# Patient Record
Sex: Male | Born: 1940 | Race: Black or African American | Hispanic: No | Marital: Married | Smoking: Former smoker
Health system: Southern US, Community
[De-identification: ages and names within clinical notes are randomized; demographics above are authoritative.]

## PROBLEM LIST (undated history)

## (undated) DIAGNOSIS — C801 Malignant (primary) neoplasm, unspecified: Secondary | ICD-10-CM

## (undated) DIAGNOSIS — D759 Disease of blood and blood-forming organs, unspecified: Secondary | ICD-10-CM

## (undated) DIAGNOSIS — H409 Unspecified glaucoma: Secondary | ICD-10-CM

## (undated) DIAGNOSIS — J45909 Unspecified asthma, uncomplicated: Secondary | ICD-10-CM

## (undated) HISTORY — PX: EYE SURGERY: SHX253

## (undated) HISTORY — PX: PROSTATE SURGERY: SHX751

## (undated) HISTORY — PX: TONSILLECTOMY: SUR1361

## (undated) HISTORY — PX: JOINT REPLACEMENT: SHX530

---

## 1997-10-06 ENCOUNTER — Inpatient Hospital Stay (HOSPITAL_COMMUNITY): Admission: RE | Admit: 1997-10-06 | Discharge: 1997-10-11 | Payer: Self-pay | Admitting: Orthopedic Surgery

## 1997-10-13 ENCOUNTER — Ambulatory Visit (HOSPITAL_COMMUNITY): Admission: RE | Admit: 1997-10-13 | Discharge: 1997-10-13 | Payer: Self-pay | Admitting: Orthopedic Surgery

## 1997-10-17 ENCOUNTER — Emergency Department (HOSPITAL_COMMUNITY): Admission: EM | Admit: 1997-10-17 | Discharge: 1997-10-17 | Payer: Self-pay | Admitting: Emergency Medicine

## 1997-10-20 ENCOUNTER — Ambulatory Visit (HOSPITAL_COMMUNITY): Admission: RE | Admit: 1997-10-20 | Discharge: 1997-10-20 | Payer: Self-pay | Admitting: Orthopedic Surgery

## 1997-10-22 ENCOUNTER — Ambulatory Visit (HOSPITAL_COMMUNITY): Admission: RE | Admit: 1997-10-22 | Discharge: 1997-10-22 | Payer: Self-pay | Admitting: *Deleted

## 1997-11-05 ENCOUNTER — Inpatient Hospital Stay (HOSPITAL_COMMUNITY): Admission: RE | Admit: 1997-11-05 | Discharge: 1997-11-08 | Payer: Self-pay | Admitting: Orthopedic Surgery

## 1998-05-24 ENCOUNTER — Ambulatory Visit (HOSPITAL_COMMUNITY): Admission: RE | Admit: 1998-05-24 | Discharge: 1998-05-24 | Payer: Self-pay | Admitting: Orthopedic Surgery

## 1998-05-24 ENCOUNTER — Encounter: Payer: Self-pay | Admitting: Orthopedic Surgery

## 1999-01-24 ENCOUNTER — Other Ambulatory Visit: Admission: RE | Admit: 1999-01-24 | Discharge: 1999-01-24 | Payer: Self-pay | Admitting: Urology

## 1999-03-24 ENCOUNTER — Encounter: Payer: Self-pay | Admitting: Urology

## 1999-03-28 ENCOUNTER — Encounter (INDEPENDENT_AMBULATORY_CARE_PROVIDER_SITE_OTHER): Payer: Self-pay | Admitting: Specialist

## 1999-03-28 ENCOUNTER — Inpatient Hospital Stay (HOSPITAL_COMMUNITY): Admission: RE | Admit: 1999-03-28 | Discharge: 1999-04-01 | Payer: Self-pay | Admitting: Urology

## 1999-12-19 ENCOUNTER — Encounter (INDEPENDENT_AMBULATORY_CARE_PROVIDER_SITE_OTHER): Payer: Self-pay | Admitting: *Deleted

## 1999-12-19 ENCOUNTER — Ambulatory Visit (HOSPITAL_COMMUNITY): Admission: RE | Admit: 1999-12-19 | Discharge: 1999-12-19 | Payer: Self-pay | Admitting: Gastroenterology

## 2001-11-25 ENCOUNTER — Encounter: Payer: Self-pay | Admitting: Internal Medicine

## 2001-11-25 ENCOUNTER — Encounter: Admission: RE | Admit: 2001-11-25 | Discharge: 2001-11-25 | Payer: Self-pay | Admitting: Internal Medicine

## 2007-11-14 ENCOUNTER — Ambulatory Visit (HOSPITAL_BASED_OUTPATIENT_CLINIC_OR_DEPARTMENT_OTHER): Admission: RE | Admit: 2007-11-14 | Discharge: 2007-11-14 | Payer: Self-pay | Admitting: Orthopedic Surgery

## 2007-11-14 ENCOUNTER — Encounter (INDEPENDENT_AMBULATORY_CARE_PROVIDER_SITE_OTHER): Payer: Self-pay | Admitting: Orthopedic Surgery

## 2010-08-08 NOTE — Op Note (Signed)
NAME:  FRANCISO, DIERKS NO.:  [id number]   MEDICAL RECORD NO.:  [id number]          PATIENT TYPE:  AMB   LOCATION:  DSC                          FACILITY:  [facility]   PHYSICIAN:  Katy Fitch. Sypher, M.D. DATE OF BIRTH:  29-Dec-1940   DATE OF PROCEDURE:  11/14/2007  DATE OF DISCHARGE:                               OPERATIVE REPORT   PREOPERATIVE DIAGNOSIS:  Enlarging uncomfortable mass, right palm  overlying right ring finger flexor sheath, A1 pulley, and extending  towards small finger flexor sheath.   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   OPERATION:  Excisional biopsy of complex myxomatous mass, right palm.   OPERATING SURGEON:  Katy Fitch. Sypher, MD   No assistant.   ANESTHESIA:  Lidocaine 2% supplemented by IV sedation.  Supervising  anesthesiologist is Quita Skye. Krista Blue, MD.   INDICATIONS:  Joel Howell. Russett is a 70 year old gentleman referred  through the courtesy of Dr. Kirby Funk for evaluation and management  of an enlarging right palmar mass.  Clinical examination revealed an  enlarging mass consistent with a possible giant cell tumor or ganglion  adjacent to the flexor sheath of the ring finger extending ulnarly.  This was uncomfortable with pressure.   We advised excisional biopsy for diagnosis and hopeful resolution of  this predicament.   After informed consent, he is brought to the operating room at this  time.  Preoperatively, he was advised that we could not offer prognosis  until a diagnosis was achieved.   PROCEDURE:  Joel Howell. Bernardini was brought to the operating room and  placed in a supine position on the operating table.   Following sedation, the right arm was prepped with Betadine soap  solution and sterilely  draped.  Lidocaine 2% was infiltrated into the  path of the intended incision and around and in the flexor sheath of the  ring finger.   After 5 minutes, anesthesia was excellent.   The right arm was exsanguinated with an Esmarch bandage and an arterial  tourniquet on the proximal brachium inflated to 235 mmHg.   Procedure commenced with a partial Brunner zig-zag incision centered  over the distal palmar crease.  Subcutaneous tissues were carefully  divided revealing a tense purplish gray myxomatous mass.  This was not  the typical flexor sheath ganglion that was well circumscribed.  This  was adherent to the palmar fascia.  Subcutaneous fat and surrounded the  neurovascular bundle servicing the ring and small finger.   The neurovascular bundle was gently dissected free.  The mass was  clearly adherent to the pretendinous fibers of the palmar fascia.  A  portion of the mass was filled with tense myxomatous fluid, which was  drained through a small longitudinal incision followed by careful  circumferential dissection.  Due to the extent of the mass which  measured 2.5 cm in length and about 2 cm  in width, we extended the  incision proximal to the distal palmar crease creating an extensile  exposure of the flexor sheath and neurovascular bundles servicing the  long and ring finger and ring and small fingers.   This was circumferentially dissected and removed with some myxomatous  material extending into the subcutaneous fat.   This appeared to be some type of degenerative myxomatous lesion that was  infiltrating.  There was no neovascularization noted.   The specimen was passed off in the formalin for pathologic evaluation  including some of the myxomatous-appearing fat.   The wound was then inspected for bleeding points.  Tourniquet released.  There was no significant bleeding encountered.  The wound was then  repaired with a corner suture of 5-0 nylon in simple interrupted  sutures  of 5-0 nylon.   For aftercare, Mr. Thurman was placed in compressive dressing with  Xeroflo, sterile gauze, and in a 2-inch Ace bandage.   We will see him back in followup in our office in 1 week for dressing  change, review of his pathology, and probable suture removal.      Katy Fitch. Sypher, M.D.  Electronically Signed     RVS/MEDQ  D:  11/14/2007  T:  11/14/2007  Job:  [id number]

## 2011-01-02 DIAGNOSIS — H44119 Panuveitis, unspecified eye: Secondary | ICD-10-CM | POA: Insufficient documentation

## 2011-01-02 DIAGNOSIS — H409 Unspecified glaucoma: Secondary | ICD-10-CM | POA: Insufficient documentation

## 2011-01-08 DIAGNOSIS — H4011X0 Primary open-angle glaucoma, stage unspecified: Secondary | ICD-10-CM | POA: Insufficient documentation

## 2011-01-08 DIAGNOSIS — H44519 Absolute glaucoma, unspecified eye: Secondary | ICD-10-CM | POA: Insufficient documentation

## 2011-01-08 DIAGNOSIS — H401190 Primary open-angle glaucoma, unspecified eye, stage unspecified: Secondary | ICD-10-CM | POA: Insufficient documentation

## 2012-07-29 ENCOUNTER — Other Ambulatory Visit: Payer: Self-pay | Admitting: Gastroenterology

## 2012-09-12 ENCOUNTER — Encounter (HOSPITAL_COMMUNITY): Payer: Self-pay | Admitting: *Deleted

## 2012-09-12 DIAGNOSIS — C801 Malignant (primary) neoplasm, unspecified: Secondary | ICD-10-CM

## 2012-09-12 HISTORY — DX: Malignant (primary) neoplasm, unspecified: C80.1

## 2012-09-19 ENCOUNTER — Encounter (HOSPITAL_COMMUNITY): Payer: Self-pay | Admitting: Pharmacy Technician

## 2012-09-30 ENCOUNTER — Encounter (HOSPITAL_COMMUNITY): Payer: Self-pay

## 2012-09-30 ENCOUNTER — Encounter (HOSPITAL_COMMUNITY): Payer: Self-pay | Admitting: Anesthesiology

## 2012-09-30 ENCOUNTER — Ambulatory Visit (HOSPITAL_COMMUNITY): Payer: PRIVATE HEALTH INSURANCE | Admitting: Anesthesiology

## 2012-09-30 ENCOUNTER — Encounter (HOSPITAL_COMMUNITY)
Admission: RE | Disposition: A | Discharge status: Home / Self Care | Payer: Self-pay | Source: Ambulatory Visit | Attending: Gastroenterology

## 2012-09-30 ENCOUNTER — Ambulatory Visit (HOSPITAL_COMMUNITY)
Admission: RE | Admit: 2012-09-30 | Discharge: 2012-09-30 | Disposition: A | Discharge status: Home / Self Care | Payer: PRIVATE HEALTH INSURANCE | Source: Ambulatory Visit | Attending: Gastroenterology | Admitting: Gastroenterology

## 2012-09-30 DIAGNOSIS — Z96659 Presence of unspecified artificial knee joint: Secondary | ICD-10-CM | POA: Insufficient documentation

## 2012-09-30 DIAGNOSIS — Z860100 Personal history of colon polyps, unspecified: Secondary | ICD-10-CM | POA: Insufficient documentation

## 2012-09-30 DIAGNOSIS — Z09 Encounter for follow-up examination after completed treatment for conditions other than malignant neoplasm: Secondary | ICD-10-CM | POA: Insufficient documentation

## 2012-09-30 DIAGNOSIS — Z8546 Personal history of malignant neoplasm of prostate: Secondary | ICD-10-CM | POA: Insufficient documentation

## 2012-09-30 DIAGNOSIS — E7800 Pure hypercholesterolemia, unspecified: Secondary | ICD-10-CM | POA: Insufficient documentation

## 2012-09-30 DIAGNOSIS — I10 Essential (primary) hypertension: Secondary | ICD-10-CM | POA: Insufficient documentation

## 2012-09-30 HISTORY — DX: Unspecified glaucoma: H40.9

## 2012-09-30 HISTORY — DX: Disease of blood and blood-forming organs, unspecified: D75.9

## 2012-09-30 HISTORY — PX: COLONOSCOPY WITH PROPOFOL: SHX5780

## 2012-09-30 HISTORY — DX: Malignant (primary) neoplasm, unspecified: C80.1

## 2012-09-30 HISTORY — DX: Unspecified asthma, uncomplicated: J45.909

## 2012-09-30 SURGERY — COLONOSCOPY WITH PROPOFOL
Anesthesia: Monitor Anesthesia Care

## 2012-09-30 MED ORDER — LACTATED RINGERS IV SOLN
INTRAVENOUS | Status: DC | PRN
  Administered 2012-09-30: 12:00:00 via INTRAVENOUS

## 2012-09-30 MED ORDER — SODIUM CHLORIDE 0.9 % IV SOLN
INTRAVENOUS | Status: DC
  Administered 2012-09-30: 1000 mL via INTRAVENOUS

## 2012-09-30 MED ORDER — MIDAZOLAM HCL 5 MG/5ML IJ SOLN
INTRAMUSCULAR | Status: DC | PRN
  Administered 2012-09-30: 2 mg via INTRAVENOUS

## 2012-09-30 MED ORDER — KETAMINE HCL 10 MG/ML IJ SOLN
INTRAMUSCULAR | Status: DC | PRN
  Administered 2012-09-30: 20 mg via INTRAVENOUS

## 2012-09-30 MED ORDER — PROPOFOL INFUSION 10 MG/ML OPTIME
INTRAVENOUS | Status: DC | PRN
  Administered 2012-09-30: 140 ug/kg/min via INTRAVENOUS

## 2012-09-30 MED ORDER — LACTATED RINGERS IV SOLN: INTRAVENOUS | Status: DC

## 2012-09-30 SURGICAL SUPPLY — 21 items

## 2012-09-30 NOTE — Transfer of Care (Signed)
Immediate Anesthesia Transfer of Care Note  Patient: Joel Howell  Procedure(s) Performed: Procedure(s): COLONOSCOPY WITH PROPOFOL (N/A)  Patient Location: PACU and Endoscopy Unit  Anesthesia Type:MAC  Level of Consciousness: awake and alert   Airway & Oxygen Therapy: Patient Spontanous Breathing and Patient connected to face mask oxygen  Post-op Assessment: Report given to PACU RN and Post -op Vital signs reviewed and stable  Post vital signs: Reviewed and stable  Complications: No apparent anesthesia complications

## 2012-09-30 NOTE — H&P (Signed)
  Procedure: Surveillance colonoscopy.  History: The patient is a 72 year old male born 124-Apr-1942. The patient has undergone colonoscopic exams in the past to remove adenomatous colon polyps. He is scheduled to undergo a repeat surveillance colonoscopy today.  Past medical history: Hypertension. Prostate cancer surgery. Duodenal ulcer bleeds. Remote treatment for H. pylori gastritis. Thalassemia minor. Knee replacement surgery. Hypercholesterolemia. Colonic diverticulosis. Glaucoma.  Habits: The patient smoking cigarettes in 1981. He consumes alcohol in moderation.  Medication allergies: Penicillin  Exam: The patient is alert and lying comfortably on the endoscopy stretcher. Abdomen is soft and nontender to palpation. Cardiac exam reveals a regular rhythm. Lungs are clear to auscultation  Plan: Proceed with surveillance colonoscopy.

## 2012-09-30 NOTE — Anesthesia Preprocedure Evaluation (Signed)
Anesthesia Evaluation  Patient identified by MRN, date of birth, ID band Patient awake    Reviewed: Allergy & Precautions, H&P , NPO status , Patient's Chart, lab work & pertinent test results  Airway Mallampati: III TM Distance: >3 FB Neck ROM: Full    Dental  (+) Teeth Intact, Caps and Dental Advisory Given   Pulmonary neg pulmonary ROS,  breath sounds clear to auscultation  Pulmonary exam normal       Cardiovascular negative cardio ROS  Rhythm:Regular     Neuro/Psych negative neurological ROS  negative psych ROS   GI/Hepatic negative GI ROS, Neg liver ROS,   Endo/Other  negative endocrine ROS  Renal/GU negative Renal ROS  negative genitourinary   Musculoskeletal negative musculoskeletal ROS (+)   Abdominal   Peds  Hematology  (+) Blood dyscrasia, anemia ,   Anesthesia Other Findings   Reproductive/Obstetrics                           Anesthesia Physical Anesthesia Plan  ASA: II  Anesthesia Plan: MAC   Post-op Pain Management:    Induction: Intravenous  Airway Management Planned: Simple Face Mask  Additional Equipment:   Intra-op Plan:   Post-operative Plan: Extubation in OR  Informed Consent: I have reviewed the patients History and Physical, chart, labs and discussed the procedure including the risks, benefits and alternatives for the proposed anesthesia with the patient or authorized representative who has indicated his/her understanding and acceptance.   Dental advisory given  Plan Discussed with: CRNA  Anesthesia Plan Comments:         Anesthesia Quick Evaluation

## 2012-09-30 NOTE — Op Note (Signed)
Procedure: Surveillance colonoscopy. History of adenomatous colon polyps.  Endoscopist: Danise Edge  Premedication: Propofol administered by anesthesia  Procedure: The patient was placed in the left lateral decubitus position. Anal inspection and digital rectal exam were normal. The Pentax pediatric colonoscope was introduced into the rectum and advanced to the cecum. A normal-appearing ileocecal valve and appendiceal orifice were identified. Colonic preparation for the exam today was good except in the cecum and left colon where there was residual solid fecal matter.   Rectum. Normal. Retroflex view of the distal rectum normal.  Sigmoid colon and descending colon. Normal.  Splenic flexure. Normal.  Transverse colon. Normal.  Hepatic flexure. Normal.  Ascending colon. Normal.  Cecum and ileocecal valve. Normal.  Assessment: Normal surveillance proctocolonoscopy to the cecum  Recommendations: Schedule repeat surveillance colonoscopy in 5 years.

## 2012-09-30 NOTE — Anesthesia Postprocedure Evaluation (Signed)
Anesthesia Post Note  Patient: Joel Howell  Procedure(s) Performed: Procedure(s) (LRB): COLONOSCOPY WITH PROPOFOL (N/A)  Anesthesia type: MAC  Patient location: PACU  Post pain: Pain level controlled  Post assessment: Post-op Vital signs reviewed  Last Vitals:  Filed Vitals:   09/30/12 1321  BP: 125/36  Pulse:   Temp:   Resp: 12    Post vital signs: Reviewed  Level of consciousness: sedated  Complications: No apparent anesthesia complications

## 2012-10-01 ENCOUNTER — Encounter (HOSPITAL_COMMUNITY): Payer: Self-pay | Admitting: Gastroenterology

## 2013-10-08 ENCOUNTER — Other Ambulatory Visit: Payer: Self-pay | Admitting: *Deleted

## 2013-10-08 DIAGNOSIS — R200 Anesthesia of skin: Secondary | ICD-10-CM

## 2013-11-18 ENCOUNTER — Ambulatory Visit (INDEPENDENT_AMBULATORY_CARE_PROVIDER_SITE_OTHER): Payer: Medicare HMO | Admitting: Neurology

## 2013-11-18 DIAGNOSIS — R200 Anesthesia of skin: Secondary | ICD-10-CM

## 2013-11-18 DIAGNOSIS — G5622 Lesion of ulnar nerve, left upper limb: Secondary | ICD-10-CM

## 2013-11-18 DIAGNOSIS — G5603 Carpal tunnel syndrome, bilateral upper limbs: Secondary | ICD-10-CM

## 2013-11-18 DIAGNOSIS — R209 Unspecified disturbances of skin sensation: Secondary | ICD-10-CM

## 2013-11-18 DIAGNOSIS — M5412 Radiculopathy, cervical region: Secondary | ICD-10-CM

## 2013-11-18 NOTE — Procedures (Signed)
[facility]  [address]  [place], [place] [address] Tel: [phone] Fax:  [phone] Test Date:  11/18/2013  Patient: Joel Howell DOB: March 01, 1941 Physician: Narda Amber  Sex: Male Height: 6\' 2"  Ref Phys: Irven Shelling  ID#: [id number] Temp: 35.2 Technician:    Patient Complaints: This is a 73 year-old gentleman presenting for evaluation of left hand numbness/tingling which is worse at night. No associated weakness or neck pain.  NCV & EMG Findings: Extensive electrodiagnostic testing of the left upper extremity and additional studies of the right reveals:  1. Left median sensory response is absent. The right median and bilateral ulnar sensory responses are prolonged with preserved amplitudes. Bilateral radial sensory responses are within normal limits. 2. Left median motor nerve showed prolonged distal onset latency (6.5 ms) and borderline decreased conduction velocity (Elbow-Wrist, 47 m/s).  The right median motor nerve showed decreased conduction velocity (Elbow-Wrist, 46 m/s), suggesting that conduction velocity findings are most likely due to using longer lengths when stimulating the nerve, due to patient's height and larger body habitus.  The left ulnar motor nerve showed prolonged distal onset latency (3.4 ms), reduced amplitude (6.3 mV), and decreased conduction velocity (A Elbow-B Elbow, 42 m/s).   3. F Wave latencies were within normal limits.   4. On needle electrode examination, chronic motor axon loss changes are seen affecting the ulnar innervated muscles on the left without accompanied active changes. Additionally, mild motor axonal loss changes are seen affecting the left abductor pollicis brevis and bilateral pronator teres and triceps muscles.  Impression: 1. Bilateral median neuropathy, at or distal to the wrist, consistent with the clinical diagnosis of carpal tunnel syndrome, markedly worse on the left. Overall, these findings are  moderate-to-severe in degree electrically on the left and mild on the right side. 2. Left ulnar neuropathy across the elbow, demyelinating with secondary axon loss, mild-to-moderate in degree electrically. 3. 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 4. Demyelinating changes involving the right ulnar sensory nerves and borderline slowing of conduction velocities involving the median motor nerves most likely are due to stimulating the nerves at the longer distances because of patient's body habitus, and unlikely to represent a polyradiculoneuropathy.  If clinically indicated, a repeat study in 3-6 months can be considered.  ___________________________ Narda Amber    Nerve Conduction Studies Anti Sensory Summary Table   Stim Site NR Peak (ms) Norm Peak (ms) P-T Amp (V) Norm P-T Amp  Left Median Anti Sensory (2nd Digit)  Wrist NR  <3.8  >10  Right Median Anti Sensory (2nd Digit)    distance 14cm due to large hands  Wrist    4.9 <3.8 15.5 >10  Left Radial Anti Sensory (Base 1st Digit)  Wrist    2.8 <2.8 12.6 >10  Right Radial Anti Sensory (Base 1st Digit)  Wrist    2.8 <2.8 16.0 >10  Left Ulnar Anti Sensory (5th Digit)    distance 14cm due to large hands  Wrist    3.9 <3.2 9.6 >5  Right Ulnar Anti Sensory (5th Digit)  Wrist    4.3 <3.2 13.9 >5   Motor Summary Table   Stim Site NR Onset (ms) Norm Onset (ms) O-P Amp (mV) Norm O-P Amp Site1 Site2 Delta-0 (ms) Dist (cm) Vel (m/s) Norm Vel (m/s)  Left Median Motor (Abd Poll Brev)  Wrist    6.5 <4.0 5.7 >5 Elbow Wrist 7.7 36.0 47 >50  Elbow  14.2  5.9  Axilla Elbow 8.2 0.0    Axilla    6.0  2.9         Right Median Motor (Abd Poll Brev)    distance  from wrist to APB 6cm  Wrist    3.8 <4.0 9.0 >5 Elbow Wrist 7.4 34.0 46 >50  Elbow    11.2  8.1         Left Ulnar Motor (Abd Dig Minimi)  Wrist    3.4 <3.1 6.3 >7 B Elbow Wrist 5.2 27.0 52 >50  B Elbow    8.6  5.4  A Elbow B Elbow  2.6 11.0 42 >50  A Elbow    11.2  5.0         Right Ulnar Motor (Abd Dig Minimi)  Wrist    3.1 <3.1 7.2 >7 B Elbow Wrist 5.1 27.0 53 >50  B Elbow    8.2  6.6  A Elbow B Elbow 2.3 12.0 52 >50  A Elbow    10.5  6.0          F Wave Studies   NR F-Lat (ms) Lat Norm (ms) L-R F-Lat (ms)  Left Ulnar (Mrkrs) (Abd Dig Min)     27.20 <33    EMG   Side Muscle Ins Act Fibs Psw Fasc Number Recrt Dur Dur. Amp Amp. Poly Poly. Comment  Left 1stDorInt Nml Nml Nml Nml 1- Mod-R Some 1+ Few 1+ Nml Nml N/A  Left Abd Poll Brev Nml Nml Nml Nml 1- Mod-R Few 1+ Nml Nml Nml Nml N/A  Left ABD Dig Min Nml Nml Nml Nml 1- Mod-R Some 1+ Nml Nml Nml Nml N/A  Left Ext Indicis Nml Nml Nml Nml Nml Nml Nml Nml Nml Nml Nml Nml N/A  Left PronatorTeres Nml Nml Nml Nml 1- Mod Few 1+ Nml Nml Nml Nml N/A  Left Biceps Nml Nml Nml Nml Nml Nml Nml Nml Nml Nml Nml Nml N/A  Left Triceps Nml Nml Nml Nml 1- Mod-R Few 1+ Nml Nml Nml Nml N/A  Left FlexDigProf 4,5 Nml Nml Nml Nml 1- Mod-R Some 1+ Nml Nml Nml Nml N/A  Left Deltoid Nml Nml Nml Nml Nml Nml Nml Nml Nml Nml Nml Nml N/A  Right 1stDorInt Nml Nml Nml Nml Nml Nml Nml Nml Nml Nml Nml Nml N/A  Right Abd Poll Brev Nml Nml Nml Nml Nml Nml Nml Nml Nml Nml Nml Nml N/A  Right PronatorTeres Nml Nml Nml Nml 1- Mod-R Few 1+ Nml Nml Nml Nml N/A  Right Biceps Nml Nml Nml Nml Nml Nml Nml Nml Nml Nml Nml Nml N/A  Right Triceps Nml Nml Nml Nml 1- Mod-R Few 1+ Nml Nml Nml Nml N/A      Waveforms:

## 2015-04-22 DIAGNOSIS — R5383 Other fatigue: Secondary | ICD-10-CM | POA: Diagnosis not present

## 2015-04-22 DIAGNOSIS — R0600 Dyspnea, unspecified: Secondary | ICD-10-CM | POA: Diagnosis not present

## 2015-04-26 DIAGNOSIS — I499 Cardiac arrhythmia, unspecified: Secondary | ICD-10-CM | POA: Insufficient documentation

## 2015-04-26 DIAGNOSIS — R0600 Dyspnea, unspecified: Secondary | ICD-10-CM | POA: Insufficient documentation

## 2015-04-29 ENCOUNTER — Ambulatory Visit (INDEPENDENT_AMBULATORY_CARE_PROVIDER_SITE_OTHER): Payer: PPO

## 2015-04-29 DIAGNOSIS — I499 Cardiac arrhythmia, unspecified: Secondary | ICD-10-CM

## 2015-04-29 DIAGNOSIS — R0600 Dyspnea, unspecified: Secondary | ICD-10-CM

## 2015-05-02 DIAGNOSIS — I499 Cardiac arrhythmia, unspecified: Secondary | ICD-10-CM | POA: Diagnosis not present

## 2015-05-02 DIAGNOSIS — R0600 Dyspnea, unspecified: Secondary | ICD-10-CM | POA: Diagnosis not present

## 2015-05-16 DIAGNOSIS — H2513 Age-related nuclear cataract, bilateral: Secondary | ICD-10-CM | POA: Diagnosis not present

## 2015-05-16 DIAGNOSIS — H401133 Primary open-angle glaucoma, bilateral, severe stage: Secondary | ICD-10-CM | POA: Diagnosis not present

## 2015-06-15 DIAGNOSIS — Z1389 Encounter for screening for other disorder: Secondary | ICD-10-CM | POA: Diagnosis not present

## 2015-06-15 DIAGNOSIS — E7800 Pure hypercholesterolemia, unspecified: Secondary | ICD-10-CM | POA: Diagnosis not present

## 2015-06-15 DIAGNOSIS — I10 Essential (primary) hypertension: Secondary | ICD-10-CM | POA: Diagnosis not present

## 2015-06-15 DIAGNOSIS — Z0000 Encounter for general adult medical examination without abnormal findings: Secondary | ICD-10-CM | POA: Diagnosis not present

## 2015-06-27 ENCOUNTER — Ambulatory Visit (INDEPENDENT_AMBULATORY_CARE_PROVIDER_SITE_OTHER): Payer: PPO | Admitting: Podiatry

## 2015-06-27 ENCOUNTER — Encounter: Payer: Self-pay | Admitting: Podiatry

## 2015-06-27 VITALS — BP 167/86 | HR 53 | Resp 17

## 2015-06-27 DIAGNOSIS — B351 Tinea unguium: Secondary | ICD-10-CM

## 2015-06-27 DIAGNOSIS — M79676 Pain in unspecified toe(s): Secondary | ICD-10-CM | POA: Diagnosis not present

## 2015-06-27 NOTE — Progress Notes (Signed)
   Subjective:    Patient ID: Joel Howell, male    DOB: [birth year], 75 y.o.   MRN: [id number]  HPI 75 year old male presents the office if or concerns of thick, painful, elongated toenails that he cannot trim himself. Denies any surrounding redness or drainage. Nails are painful particularly in shoe gear and with pressure. No other complaints at this time.  Review of Systems  All other systems reviewed and are negative.      Objective:   Physical Exam General: AAO x3, NAD  Dermatological: Nails are hypertrophic, dystrophic, brittle, discolored, elongated 10. No swelling erythema or drainage. Tenderness nails 1-5 bilaterally. No open lesions or pre-ulcer lesions identified at this time.  Vascular: Dorsalis Pedis artery and Posterior Tibial artery pedal pulses are 2/4 bilateral with immedate capillary fill time. There is no pain with calf compression, swelling, warmth, erythema.   Neruologic: Grossly intact via light touch bilateral. Vibratory intact via tuning fork bilateral. Protective threshold with Semmes Wienstein monofilament intact to all pedal sites bilateral.  Musculoskeletal: No gross boney pedal deformities bilateral. No pain, crepitus, or limitation noted with foot and ankle range of motion bilateral. Muscular strength 5/5 in all groups tested bilateral.  Gait: Unassisted, Nonantalgic.      Assessment & Plan:  75 year old male with symptomatic onychomycosis -Treatment options discussed including all alternatives, risks, and complications -Etiology of symptoms were discussed -Nails debrided 10 without complications or bleeding. -Discussed daily foot inspection. -Follow-up in 3 months or sooner if any problems arise. In the meantime, encouraged to call the office with any questions, concerns, change in symptoms.   Celesta Gentile, DPM

## 2015-06-30 ENCOUNTER — Encounter: Payer: Self-pay | Admitting: Podiatry

## 2015-08-29 DIAGNOSIS — H401133 Primary open-angle glaucoma, bilateral, severe stage: Secondary | ICD-10-CM | POA: Diagnosis not present

## 2015-08-29 DIAGNOSIS — H2513 Age-related nuclear cataract, bilateral: Secondary | ICD-10-CM | POA: Diagnosis not present

## 2015-10-27 DIAGNOSIS — N500 Atrophy of testis: Secondary | ICD-10-CM | POA: Diagnosis not present

## 2015-11-29 DIAGNOSIS — H2513 Age-related nuclear cataract, bilateral: Secondary | ICD-10-CM | POA: Diagnosis not present

## 2015-11-29 DIAGNOSIS — H401133 Primary open-angle glaucoma, bilateral, severe stage: Secondary | ICD-10-CM | POA: Diagnosis not present

## 2015-12-27 DIAGNOSIS — R0600 Dyspnea, unspecified: Secondary | ICD-10-CM | POA: Diagnosis not present

## 2016-02-01 DIAGNOSIS — R05 Cough: Secondary | ICD-10-CM | POA: Diagnosis not present

## 2016-02-10 ENCOUNTER — Encounter: Payer: Self-pay | Admitting: Podiatry

## 2016-02-10 ENCOUNTER — Ambulatory Visit (INDEPENDENT_AMBULATORY_CARE_PROVIDER_SITE_OTHER): Payer: PPO | Admitting: Podiatry

## 2016-02-10 DIAGNOSIS — M79676 Pain in unspecified toe(s): Secondary | ICD-10-CM | POA: Diagnosis not present

## 2016-02-10 DIAGNOSIS — B351 Tinea unguium: Secondary | ICD-10-CM | POA: Diagnosis not present

## 2016-02-14 NOTE — Progress Notes (Signed)
Subjective: 75 y.o. returns the office today for painful, elongated, thickened toenails which he cannot trim himself. Denies any redness or drainage around the nails. Denies any acute changes since last appointment and no new complaints today. Denies any systemic complaints such as fevers, chills, nausea, vomiting.   Objective: AAO 3, NAD DP/PT pulses palpable, CRT less than 3 seconds Protective sensation 10 with Simms Weinstein monofilament, Achilles tendon reflex intact.  Nails hypertrophic, dystrophic, elongated, brittle, discolored 10. There is tenderness overlying the nails 1-5 bilaterally. There is no surrounding erythema or drainage along the nail sites. No open lesions or pre-ulcerative lesions are identified. No other areas of tenderness bilateral lower extremities. No overlying edema, erythema, increased warmth. No pain with calf compression, swelling, warmth, erythema.  Assessment: Patient presents with symptomatic onychomycosis  Plan: -Treatment options including alternatives, risks, complications were discussed -Nails sharply debrided 10 without complication/bleeding. -Discussed daily foot inspection. If there are any changes, to call the office immediately.  -Follow-up in 3 months or sooner if any problems are to arise. In the meantime, encouraged to call the office with any questions, concerns, changes symptoms.  Celesta Gentile, DPM

## 2016-04-13 DIAGNOSIS — H401133 Primary open-angle glaucoma, bilateral, severe stage: Secondary | ICD-10-CM | POA: Diagnosis not present

## 2016-04-13 DIAGNOSIS — H2513 Age-related nuclear cataract, bilateral: Secondary | ICD-10-CM | POA: Diagnosis not present

## 2016-05-04 DIAGNOSIS — H401133 Primary open-angle glaucoma, bilateral, severe stage: Secondary | ICD-10-CM | POA: Diagnosis not present

## 2016-05-04 DIAGNOSIS — H2513 Age-related nuclear cataract, bilateral: Secondary | ICD-10-CM | POA: Diagnosis not present

## 2016-05-25 DIAGNOSIS — H401133 Primary open-angle glaucoma, bilateral, severe stage: Secondary | ICD-10-CM | POA: Diagnosis not present

## 2016-05-25 DIAGNOSIS — H2513 Age-related nuclear cataract, bilateral: Secondary | ICD-10-CM | POA: Diagnosis not present

## 2016-06-14 DIAGNOSIS — H401123 Primary open-angle glaucoma, left eye, severe stage: Secondary | ICD-10-CM | POA: Diagnosis not present

## 2016-06-19 DIAGNOSIS — I10 Essential (primary) hypertension: Secondary | ICD-10-CM | POA: Diagnosis not present

## 2016-06-19 DIAGNOSIS — J4520 Mild intermittent asthma, uncomplicated: Secondary | ICD-10-CM | POA: Diagnosis not present

## 2016-06-19 DIAGNOSIS — N529 Male erectile dysfunction, unspecified: Secondary | ICD-10-CM | POA: Diagnosis not present

## 2016-06-19 DIAGNOSIS — Z0000 Encounter for general adult medical examination without abnormal findings: Secondary | ICD-10-CM | POA: Diagnosis not present

## 2016-06-19 DIAGNOSIS — E7800 Pure hypercholesterolemia, unspecified: Secondary | ICD-10-CM | POA: Diagnosis not present

## 2016-06-19 DIAGNOSIS — Z7189 Other specified counseling: Secondary | ICD-10-CM | POA: Diagnosis not present

## 2016-06-19 DIAGNOSIS — Z8546 Personal history of malignant neoplasm of prostate: Secondary | ICD-10-CM | POA: Diagnosis not present

## 2016-06-19 DIAGNOSIS — Z1389 Encounter for screening for other disorder: Secondary | ICD-10-CM | POA: Diagnosis not present

## 2016-08-29 ENCOUNTER — Ambulatory Visit (INDEPENDENT_AMBULATORY_CARE_PROVIDER_SITE_OTHER): Payer: PPO | Admitting: Podiatry

## 2016-08-29 DIAGNOSIS — M79676 Pain in unspecified toe(s): Secondary | ICD-10-CM

## 2016-08-29 DIAGNOSIS — B351 Tinea unguium: Secondary | ICD-10-CM | POA: Diagnosis not present

## 2016-08-29 NOTE — Progress Notes (Signed)
Complaint:  Visit Type: Patient returns to my office for continued preventative foot care services. Complaint: Patient states" my nails have grown long and thick and become painful to walk and wear shoes" . The patient presents for preventative foot care services. No changes to ROS  Podiatric Exam: Vascular: dorsalis pedis and posterior tibial pulses are palpable bilateral. Capillary return is immediate. Temperature gradient is WNL. Skin turgor WNL  Sensorium: Normal Semmes Weinstein monofilament test. Normal tactile sensation bilaterally. Nail Exam: Pt has thick disfigured discolored nails with subungual debris noted bilateral entire nail hallux through fifth toenails Ulcer Exam: There is no evidence of ulcer or pre-ulcerative changes or infection. Orthopedic Exam: Muscle tone and strength are WNL. No limitations in general ROM. No crepitus or effusions noted. Foot type and digits show no abnormalities. Bony prominences are unremarkable. Skin: No Porokeratosis. No infection or ulcers  Diagnosis:  Onychomycosis, , Pain in right toe, pain in left toes  Treatment & Plan Procedures and Treatment: Consent by patient was obtained for treatment procedures. The patient understood the discussion of treatment and procedures well. All questions were answered thoroughly reviewed. Debridement of mycotic and hypertrophic toenails, 1 through 5 bilateral and clearing of subungual debris. No ulceration, no infection noted.  Return Visit-Office Procedure: Patient instructed to return to the office for a follow up visit 3 months   for continued evaluation and treatment.    Hasten Sweitzer DPM 

## 2016-09-11 DIAGNOSIS — H401133 Primary open-angle glaucoma, bilateral, severe stage: Secondary | ICD-10-CM | POA: Diagnosis not present

## 2016-10-02 DIAGNOSIS — H401133 Primary open-angle glaucoma, bilateral, severe stage: Secondary | ICD-10-CM | POA: Diagnosis not present

## 2016-10-02 DIAGNOSIS — H2513 Age-related nuclear cataract, bilateral: Secondary | ICD-10-CM | POA: Diagnosis not present

## 2016-10-24 DIAGNOSIS — H401133 Primary open-angle glaucoma, bilateral, severe stage: Secondary | ICD-10-CM | POA: Diagnosis not present

## 2016-10-24 DIAGNOSIS — H2513 Age-related nuclear cataract, bilateral: Secondary | ICD-10-CM | POA: Diagnosis not present

## 2016-10-30 DIAGNOSIS — H401133 Primary open-angle glaucoma, bilateral, severe stage: Secondary | ICD-10-CM | POA: Diagnosis not present

## 2016-10-30 DIAGNOSIS — H2513 Age-related nuclear cataract, bilateral: Secondary | ICD-10-CM | POA: Diagnosis not present

## 2016-11-06 DIAGNOSIS — H35352 Cystoid macular degeneration, left eye: Secondary | ICD-10-CM | POA: Diagnosis not present

## 2016-11-06 DIAGNOSIS — H18052 Posterior corneal pigmentations, left eye: Secondary | ICD-10-CM | POA: Diagnosis not present

## 2016-11-06 DIAGNOSIS — H2012 Chronic iridocyclitis, left eye: Secondary | ICD-10-CM | POA: Diagnosis not present

## 2016-11-06 DIAGNOSIS — H2513 Age-related nuclear cataract, bilateral: Secondary | ICD-10-CM | POA: Diagnosis not present

## 2016-11-06 DIAGNOSIS — H2512 Age-related nuclear cataract, left eye: Secondary | ICD-10-CM | POA: Diagnosis not present

## 2016-11-06 DIAGNOSIS — H401133 Primary open-angle glaucoma, bilateral, severe stage: Secondary | ICD-10-CM | POA: Diagnosis not present

## 2016-11-06 DIAGNOSIS — H21542 Posterior synechiae (iris), left eye: Secondary | ICD-10-CM | POA: Diagnosis not present

## 2016-11-22 DIAGNOSIS — H2012 Chronic iridocyclitis, left eye: Secondary | ICD-10-CM | POA: Diagnosis not present

## 2016-11-22 DIAGNOSIS — H21542 Posterior synechiae (iris), left eye: Secondary | ICD-10-CM | POA: Diagnosis not present

## 2016-11-22 DIAGNOSIS — H2512 Age-related nuclear cataract, left eye: Secondary | ICD-10-CM | POA: Diagnosis not present

## 2016-11-22 DIAGNOSIS — H35352 Cystoid macular degeneration, left eye: Secondary | ICD-10-CM | POA: Diagnosis not present

## 2016-11-27 DIAGNOSIS — H401133 Primary open-angle glaucoma, bilateral, severe stage: Secondary | ICD-10-CM | POA: Diagnosis not present

## 2016-11-27 DIAGNOSIS — H2513 Age-related nuclear cataract, bilateral: Secondary | ICD-10-CM | POA: Diagnosis not present

## 2016-11-30 ENCOUNTER — Ambulatory Visit: Payer: PPO | Admitting: Podiatry

## 2016-12-06 DIAGNOSIS — H2512 Age-related nuclear cataract, left eye: Secondary | ICD-10-CM | POA: Diagnosis not present

## 2017-01-23 ENCOUNTER — Ambulatory Visit (INDEPENDENT_AMBULATORY_CARE_PROVIDER_SITE_OTHER): Payer: PPO | Admitting: Podiatry

## 2017-01-23 ENCOUNTER — Encounter: Payer: Self-pay | Admitting: Podiatry

## 2017-01-23 DIAGNOSIS — M79676 Pain in unspecified toe(s): Secondary | ICD-10-CM

## 2017-01-23 DIAGNOSIS — B351 Tinea unguium: Secondary | ICD-10-CM

## 2017-01-23 NOTE — Progress Notes (Signed)
Complaint:  Visit Type: Patient returns to my office for continued preventative foot care services. Complaint: Patient states" my nails have grown long and thick and become painful to walk and wear shoes" . The patient presents for preventative foot care services. No changes to ROS  Podiatric Exam: Vascular: dorsalis pedis and posterior tibial pulses are palpable bilateral. Capillary return is immediate. Temperature gradient is WNL. Skin turgor WNL  Sensorium: Normal Semmes Weinstein monofilament test. Normal tactile sensation bilaterally. Nail Exam: Pt has thick disfigured discolored nails with subungual debris noted bilateral entire nail hallux through fifth toenails Ulcer Exam: There is no evidence of ulcer or pre-ulcerative changes or infection. Orthopedic Exam: Muscle tone and strength are WNL. No limitations in general ROM. No crepitus or effusions noted. Foot type and digits show no abnormalities. Bony prominences are unremarkable. Skin: No Porokeratosis. No infection or ulcers  Diagnosis:  Onychomycosis, , Pain in right toe, pain in left toes  Treatment & Plan Procedures and Treatment: Consent by patient was obtained for treatment procedures. The patient understood the discussion of treatment and procedures well. All questions were answered thoroughly reviewed. Debridement of mycotic and hypertrophic toenails, 1 through 5 bilateral and clearing of subungual debris. No ulceration, no infection noted.  Return Visit-Office Procedure: Patient instructed to return to the office for a follow up visit 3 months   for continued evaluation and treatment.    Jahnaya Branscome DPM 

## 2017-03-22 DIAGNOSIS — H35352 Cystoid macular degeneration, left eye: Secondary | ICD-10-CM | POA: Diagnosis not present

## 2017-03-22 DIAGNOSIS — H401133 Primary open-angle glaucoma, bilateral, severe stage: Secondary | ICD-10-CM | POA: Diagnosis not present

## 2017-03-22 DIAGNOSIS — R102 Pelvic and perineal pain: Secondary | ICD-10-CM | POA: Diagnosis not present

## 2017-04-09 DIAGNOSIS — H401133 Primary open-angle glaucoma, bilateral, severe stage: Secondary | ICD-10-CM | POA: Diagnosis not present

## 2017-04-09 DIAGNOSIS — H35352 Cystoid macular degeneration, left eye: Secondary | ICD-10-CM | POA: Diagnosis not present

## 2017-04-09 DIAGNOSIS — Z961 Presence of intraocular lens: Secondary | ICD-10-CM | POA: Diagnosis not present

## 2017-04-11 DIAGNOSIS — R030 Elevated blood-pressure reading, without diagnosis of hypertension: Secondary | ICD-10-CM | POA: Diagnosis not present

## 2017-04-11 DIAGNOSIS — E7800 Pure hypercholesterolemia, unspecified: Secondary | ICD-10-CM | POA: Diagnosis not present

## 2017-04-15 DIAGNOSIS — H2012 Chronic iridocyclitis, left eye: Secondary | ICD-10-CM | POA: Diagnosis not present

## 2017-04-15 DIAGNOSIS — H35352 Cystoid macular degeneration, left eye: Secondary | ICD-10-CM | POA: Diagnosis not present

## 2017-04-15 DIAGNOSIS — H18052 Posterior corneal pigmentations, left eye: Secondary | ICD-10-CM | POA: Diagnosis not present

## 2017-04-15 DIAGNOSIS — H21542 Posterior synechiae (iris), left eye: Secondary | ICD-10-CM | POA: Diagnosis not present

## 2017-04-15 DIAGNOSIS — H44112 Panuveitis, left eye: Secondary | ICD-10-CM | POA: Diagnosis not present

## 2017-04-24 ENCOUNTER — Ambulatory Visit: Payer: PPO | Admitting: Podiatry

## 2017-04-25 DIAGNOSIS — H35352 Cystoid macular degeneration, left eye: Secondary | ICD-10-CM | POA: Diagnosis not present

## 2017-04-25 DIAGNOSIS — H44112 Panuveitis, left eye: Secondary | ICD-10-CM | POA: Diagnosis not present

## 2017-04-25 DIAGNOSIS — H2012 Chronic iridocyclitis, left eye: Secondary | ICD-10-CM | POA: Diagnosis not present

## 2017-04-25 DIAGNOSIS — H35031 Hypertensive retinopathy, right eye: Secondary | ICD-10-CM | POA: Diagnosis not present

## 2017-04-25 DIAGNOSIS — H21542 Posterior synechiae (iris), left eye: Secondary | ICD-10-CM | POA: Diagnosis not present

## 2017-04-29 DIAGNOSIS — Z961 Presence of intraocular lens: Secondary | ICD-10-CM | POA: Diagnosis not present

## 2017-04-29 DIAGNOSIS — H35352 Cystoid macular degeneration, left eye: Secondary | ICD-10-CM | POA: Diagnosis not present

## 2017-04-29 DIAGNOSIS — H401133 Primary open-angle glaucoma, bilateral, severe stage: Secondary | ICD-10-CM | POA: Diagnosis not present

## 2017-05-09 DIAGNOSIS — H2012 Chronic iridocyclitis, left eye: Secondary | ICD-10-CM | POA: Diagnosis not present

## 2017-05-09 DIAGNOSIS — H35352 Cystoid macular degeneration, left eye: Secondary | ICD-10-CM | POA: Diagnosis not present

## 2017-05-09 DIAGNOSIS — H401123 Primary open-angle glaucoma, left eye, severe stage: Secondary | ICD-10-CM | POA: Diagnosis not present

## 2017-05-09 DIAGNOSIS — H44112 Panuveitis, left eye: Secondary | ICD-10-CM | POA: Diagnosis not present

## 2017-05-29 DIAGNOSIS — H44112 Panuveitis, left eye: Secondary | ICD-10-CM | POA: Diagnosis not present

## 2017-05-29 DIAGNOSIS — H35352 Cystoid macular degeneration, left eye: Secondary | ICD-10-CM | POA: Diagnosis not present

## 2017-05-29 DIAGNOSIS — H35031 Hypertensive retinopathy, right eye: Secondary | ICD-10-CM | POA: Diagnosis not present

## 2017-05-29 DIAGNOSIS — H2012 Chronic iridocyclitis, left eye: Secondary | ICD-10-CM | POA: Diagnosis not present

## 2017-06-25 DIAGNOSIS — H903 Sensorineural hearing loss, bilateral: Secondary | ICD-10-CM | POA: Diagnosis not present

## 2017-07-01 DIAGNOSIS — H903 Sensorineural hearing loss, bilateral: Secondary | ICD-10-CM | POA: Diagnosis not present

## 2017-07-05 DIAGNOSIS — H903 Sensorineural hearing loss, bilateral: Secondary | ICD-10-CM | POA: Diagnosis not present

## 2017-07-15 DIAGNOSIS — H401133 Primary open-angle glaucoma, bilateral, severe stage: Secondary | ICD-10-CM | POA: Diagnosis not present

## 2017-07-15 DIAGNOSIS — Z961 Presence of intraocular lens: Secondary | ICD-10-CM | POA: Diagnosis not present

## 2017-07-15 DIAGNOSIS — H35352 Cystoid macular degeneration, left eye: Secondary | ICD-10-CM | POA: Diagnosis not present

## 2017-07-23 ENCOUNTER — Ambulatory Visit: Payer: PPO | Admitting: Podiatry

## 2017-07-23 ENCOUNTER — Encounter: Payer: Self-pay | Admitting: Podiatry

## 2017-07-23 DIAGNOSIS — M79676 Pain in unspecified toe(s): Secondary | ICD-10-CM

## 2017-07-23 DIAGNOSIS — B351 Tinea unguium: Secondary | ICD-10-CM

## 2017-07-23 NOTE — Progress Notes (Signed)
Complaint:  Visit Type: Patient returns to my office for continued preventative foot care services. Complaint: Patient states" my nails have grown long and thick and become painful to walk and wear shoes" . The patient presents for preventative foot care services. No changes to ROS  Podiatric Exam: Vascular: dorsalis pedis and posterior tibial pulses are palpable bilateral. Capillary return is immediate. Temperature gradient is WNL. Skin turgor WNL  Sensorium: Normal Semmes Weinstein monofilament test. Normal tactile sensation bilaterally. Nail Exam: Pt has thick disfigured discolored nails with subungual debris noted bilateral entire nail hallux through fifth toenails Ulcer Exam: There is no evidence of ulcer or pre-ulcerative changes or infection. Orthopedic Exam: Muscle tone and strength are WNL. No limitations in general ROM. No crepitus or effusions noted. Foot type and digits show no abnormalities. Bony prominences are unremarkable. Skin: No Porokeratosis. No infection or ulcers  Diagnosis:  Onychomycosis, , Pain in right toe, pain in left toes  Treatment & Plan Procedures and Treatment: Consent by patient was obtained for treatment procedures. The patient understood the discussion of treatment and procedures well. All questions were answered thoroughly reviewed. Debridement of mycotic and hypertrophic toenails, 1 through 5 bilateral and clearing of subungual debris. No ulceration, no infection noted.  Return Visit-Office Procedure: Patient instructed to return to the office for a follow up visit 4 months   for continued evaluation and treatment.    Rayleen Wyrick DPM 

## 2017-08-28 DIAGNOSIS — H2012 Chronic iridocyclitis, left eye: Secondary | ICD-10-CM | POA: Diagnosis not present

## 2017-08-28 DIAGNOSIS — H35031 Hypertensive retinopathy, right eye: Secondary | ICD-10-CM | POA: Diagnosis not present

## 2017-08-28 DIAGNOSIS — H35352 Cystoid macular degeneration, left eye: Secondary | ICD-10-CM | POA: Diagnosis not present

## 2017-08-28 DIAGNOSIS — H44112 Panuveitis, left eye: Secondary | ICD-10-CM | POA: Diagnosis not present

## 2017-09-09 DIAGNOSIS — Z1211 Encounter for screening for malignant neoplasm of colon: Secondary | ICD-10-CM | POA: Diagnosis not present

## 2017-09-09 DIAGNOSIS — Z0000 Encounter for general adult medical examination without abnormal findings: Secondary | ICD-10-CM | POA: Diagnosis not present

## 2017-09-09 DIAGNOSIS — Z1389 Encounter for screening for other disorder: Secondary | ICD-10-CM | POA: Diagnosis not present

## 2017-09-09 DIAGNOSIS — E7800 Pure hypercholesterolemia, unspecified: Secondary | ICD-10-CM | POA: Diagnosis not present

## 2017-09-09 DIAGNOSIS — D126 Benign neoplasm of colon, unspecified: Secondary | ICD-10-CM | POA: Diagnosis not present

## 2017-09-09 DIAGNOSIS — I10 Essential (primary) hypertension: Secondary | ICD-10-CM | POA: Diagnosis not present

## 2017-09-09 DIAGNOSIS — Z7189 Other specified counseling: Secondary | ICD-10-CM | POA: Diagnosis not present

## 2017-09-11 DIAGNOSIS — H5789 Other specified disorders of eye and adnexa: Secondary | ICD-10-CM | POA: Diagnosis not present

## 2017-10-14 DIAGNOSIS — Z961 Presence of intraocular lens: Secondary | ICD-10-CM | POA: Diagnosis not present

## 2017-10-14 DIAGNOSIS — H35352 Cystoid macular degeneration, left eye: Secondary | ICD-10-CM | POA: Diagnosis not present

## 2017-10-14 DIAGNOSIS — H401133 Primary open-angle glaucoma, bilateral, severe stage: Secondary | ICD-10-CM | POA: Diagnosis not present

## 2017-11-14 DIAGNOSIS — S46811A Strain of other muscles, fascia and tendons at shoulder and upper arm level, right arm, initial encounter: Secondary | ICD-10-CM | POA: Diagnosis not present

## 2017-11-14 DIAGNOSIS — F439 Reaction to severe stress, unspecified: Secondary | ICD-10-CM | POA: Diagnosis not present

## 2017-11-22 ENCOUNTER — Ambulatory Visit: Payer: PPO | Admitting: Podiatry

## 2017-11-28 DIAGNOSIS — H44112 Panuveitis, left eye: Secondary | ICD-10-CM | POA: Diagnosis not present

## 2017-11-28 DIAGNOSIS — H35031 Hypertensive retinopathy, right eye: Secondary | ICD-10-CM | POA: Diagnosis not present

## 2017-11-28 DIAGNOSIS — H2012 Chronic iridocyclitis, left eye: Secondary | ICD-10-CM | POA: Diagnosis not present

## 2017-11-28 DIAGNOSIS — H35352 Cystoid macular degeneration, left eye: Secondary | ICD-10-CM | POA: Diagnosis not present

## 2017-12-02 DIAGNOSIS — Z86010 Personal history of colonic polyps: Secondary | ICD-10-CM | POA: Diagnosis not present

## 2017-12-02 DIAGNOSIS — D126 Benign neoplasm of colon, unspecified: Secondary | ICD-10-CM | POA: Diagnosis not present

## 2017-12-04 DIAGNOSIS — D126 Benign neoplasm of colon, unspecified: Secondary | ICD-10-CM | POA: Diagnosis not present

## 2017-12-10 ENCOUNTER — Ambulatory Visit: Payer: PPO | Admitting: Podiatry

## 2017-12-10 ENCOUNTER — Encounter: Payer: Self-pay | Admitting: Podiatry

## 2017-12-10 DIAGNOSIS — Q828 Other specified congenital malformations of skin: Secondary | ICD-10-CM | POA: Diagnosis not present

## 2017-12-10 DIAGNOSIS — M79676 Pain in unspecified toe(s): Secondary | ICD-10-CM | POA: Diagnosis not present

## 2017-12-10 DIAGNOSIS — B351 Tinea unguium: Secondary | ICD-10-CM | POA: Diagnosis not present

## 2017-12-10 NOTE — Progress Notes (Signed)
Complaint:  Visit Type: Patient returns to my office for continued preventative foot care services. Complaint: Patient states" my nails have grown long and thick and become painful to walk and wear shoes" . The patient presents for preventative foot care services. No changes to ROS  Podiatric Exam: Vascular: dorsalis pedis and posterior tibial pulses are palpable bilateral. Capillary return is immediate. Temperature gradient is WNL. Skin turgor WNL  Sensorium: Normal Semmes Weinstein monofilament test. Normal tactile sensation bilaterally. Nail Exam: Pt has thick disfigured discolored nails with subungual debris noted bilateral entire nail hallux through fifth toenails Ulcer Exam: There is no evidence of ulcer or pre-ulcerative changes or infection. Orthopedic Exam: Muscle tone and strength are WNL. No limitations in general ROM. No crepitus or effusions noted. Foot type and digits show no abnormalities. Bony prominences are unremarkable. Skin: No Porokeratosis. No infection or ulcers  Diagnosis:  Onychomycosis, , Pain in right toe, pain in left toes  Treatment & Plan Procedures and Treatment: Consent by patient was obtained for treatment procedures. The patient understood the discussion of treatment and procedures well. All questions were answered thoroughly reviewed. Debridement of mycotic and hypertrophic toenails, 1 through 5 bilateral and clearing of subungual debris. No ulceration, no infection noted.  Return Visit-Office Procedure: Patient instructed to return to the office for a follow up visit 3 months   for continued evaluation and treatment.    Ellesse Antenucci DPM 

## 2018-01-20 DIAGNOSIS — M25562 Pain in left knee: Secondary | ICD-10-CM | POA: Insufficient documentation

## 2018-01-22 DIAGNOSIS — H35352 Cystoid macular degeneration, left eye: Secondary | ICD-10-CM | POA: Diagnosis not present

## 2018-01-22 DIAGNOSIS — H401133 Primary open-angle glaucoma, bilateral, severe stage: Secondary | ICD-10-CM | POA: Diagnosis not present

## 2018-01-22 DIAGNOSIS — Z961 Presence of intraocular lens: Secondary | ICD-10-CM | POA: Diagnosis not present

## 2018-02-12 DIAGNOSIS — M25562 Pain in left knee: Secondary | ICD-10-CM | POA: Diagnosis not present

## 2018-03-11 ENCOUNTER — Ambulatory Visit: Payer: PPO | Admitting: Podiatry

## 2018-03-12 ENCOUNTER — Ambulatory Visit: Payer: PPO | Admitting: Podiatry

## 2018-03-12 ENCOUNTER — Encounter: Payer: Self-pay | Admitting: Podiatry

## 2018-03-12 DIAGNOSIS — M79676 Pain in unspecified toe(s): Secondary | ICD-10-CM | POA: Diagnosis not present

## 2018-03-12 DIAGNOSIS — B351 Tinea unguium: Secondary | ICD-10-CM | POA: Diagnosis not present

## 2018-03-12 NOTE — Progress Notes (Signed)
Subjective: Joel Howell presents today with painful, thick toenails 1-5 b/l that he cannot cut and which interfere with daily activities.  Pain is aggravated when wearing enclosed shoe gear.  He voices no new problems on today's visit.  Lavone Orn, MD is his PCP.   Current Outpatient Medications:  .  amLODipine (NORVASC) 5 MG tablet, Take 5 mg by mouth daily., Disp: , Rfl: 11 .  atropine 1 % ophthalmic solution, , Disp: , Rfl:  .  benazepril-hydrochlorthiazide (LOTENSIN HCT) 10-12.5 MG tablet, Take by mouth., Disp: , Rfl:  .  cholecalciferol (VITAMIN D) 1000 UNITS tablet, Take 1,000 Units by mouth daily., Disp: , Rfl:  .  dorzolamide-timolol (COSOPT) 22.3-6.8 MG/ML ophthalmic solution, , Disp: , Rfl:  .  GAVILYTE-N WITH FLAVOR PACK 420 g solution, See admin instructions., Disp: , Rfl: 0 .  ketorolac (ACULAR) 0.5 % ophthalmic solution, INSTILL 1 DROP INTO LEFT EYE 4 TIMES A DAY, Disp: , Rfl: 4 .  metoCLOPramide (REGLAN) 10 MG tablet, Take 20 mg by mouth daily., Disp: , Rfl:   Allergies  Allergen Reactions  . Penicillins Swelling and Rash    Swelling of tongue    Objective:  Vascular Examination: Capillary refill time immediate x 10 digits Dorsalis pedis and Posterior tibial pulses palpable b/l Digital hair present x 10 digits Skin temperature gradient WNL b/l  Dermatological Examination: Skin with normal turgor, texture and tone b/l  Toenails 1-5 b/l discolored, thick, dystrophic with subungual debris and pain with palpation to nailbeds due to thickness of nails.  Musculoskeletal: Muscle strength 5/5 to all LE muscle groups  Neurological: Sensation intact with 10 gram monofilament. Vibratory sensation intact.  Assessment: Painful onychomycosis toenails 1-5 b/l   Plan: 1. Toenails 1-5 b/l were debrided in length and girth without iatrogenic bleeding. 2. Patient to continue soft, supportive shoe gear 3. Patient to report any pedal injuries to medical professional  immediately. 4. Follow up 3 months. Patient/POA to call should there be a concern in the interim.

## 2018-03-12 NOTE — Patient Instructions (Signed)

## 2018-03-14 DIAGNOSIS — H35352 Cystoid macular degeneration, left eye: Secondary | ICD-10-CM | POA: Diagnosis not present

## 2018-03-14 DIAGNOSIS — H401133 Primary open-angle glaucoma, bilateral, severe stage: Secondary | ICD-10-CM | POA: Diagnosis not present

## 2018-03-14 DIAGNOSIS — Z961 Presence of intraocular lens: Secondary | ICD-10-CM | POA: Diagnosis not present

## 2018-04-01 DIAGNOSIS — Z961 Presence of intraocular lens: Secondary | ICD-10-CM | POA: Diagnosis not present

## 2018-04-01 DIAGNOSIS — H401133 Primary open-angle glaucoma, bilateral, severe stage: Secondary | ICD-10-CM | POA: Diagnosis not present

## 2018-04-01 DIAGNOSIS — H35352 Cystoid macular degeneration, left eye: Secondary | ICD-10-CM | POA: Diagnosis not present

## 2018-04-24 DIAGNOSIS — Z471 Aftercare following joint replacement surgery: Secondary | ICD-10-CM | POA: Diagnosis not present

## 2018-04-24 DIAGNOSIS — M1712 Unilateral primary osteoarthritis, left knee: Secondary | ICD-10-CM | POA: Diagnosis not present

## 2018-04-24 DIAGNOSIS — Z96651 Presence of right artificial knee joint: Secondary | ICD-10-CM | POA: Diagnosis not present

## 2018-04-29 DIAGNOSIS — Z961 Presence of intraocular lens: Secondary | ICD-10-CM | POA: Diagnosis not present

## 2018-04-29 DIAGNOSIS — H401133 Primary open-angle glaucoma, bilateral, severe stage: Secondary | ICD-10-CM | POA: Diagnosis not present

## 2018-05-27 ENCOUNTER — Ambulatory Visit
Admission: RE | Admit: 2018-05-27 | Discharge: 2018-05-27 | Disposition: A | Discharge status: Home / Self Care | Payer: PPO | Source: Ambulatory Visit | Attending: Internal Medicine | Admitting: Internal Medicine

## 2018-05-27 ENCOUNTER — Other Ambulatory Visit: Payer: Self-pay | Admitting: Internal Medicine

## 2018-05-27 DIAGNOSIS — R059 Cough, unspecified: Secondary | ICD-10-CM

## 2018-05-27 DIAGNOSIS — H35352 Cystoid macular degeneration, left eye: Secondary | ICD-10-CM | POA: Diagnosis not present

## 2018-05-27 DIAGNOSIS — H35031 Hypertensive retinopathy, right eye: Secondary | ICD-10-CM | POA: Diagnosis not present

## 2018-05-27 DIAGNOSIS — S46811D Strain of other muscles, fascia and tendons at shoulder and upper arm level, right arm, subsequent encounter: Secondary | ICD-10-CM | POA: Diagnosis not present

## 2018-05-27 DIAGNOSIS — R05 Cough: Secondary | ICD-10-CM

## 2018-05-27 DIAGNOSIS — H44112 Panuveitis, left eye: Secondary | ICD-10-CM | POA: Diagnosis not present

## 2018-05-27 DIAGNOSIS — H2012 Chronic iridocyclitis, left eye: Secondary | ICD-10-CM | POA: Diagnosis not present

## 2018-06-10 DIAGNOSIS — H401133 Primary open-angle glaucoma, bilateral, severe stage: Secondary | ICD-10-CM | POA: Diagnosis not present

## 2018-06-10 DIAGNOSIS — H2511 Age-related nuclear cataract, right eye: Secondary | ICD-10-CM | POA: Diagnosis not present

## 2018-06-10 DIAGNOSIS — H35352 Cystoid macular degeneration, left eye: Secondary | ICD-10-CM | POA: Diagnosis not present

## 2018-06-10 DIAGNOSIS — Z961 Presence of intraocular lens: Secondary | ICD-10-CM | POA: Diagnosis not present

## 2018-06-11 ENCOUNTER — Ambulatory Visit: Payer: PPO | Admitting: Podiatry

## 2018-06-11 ENCOUNTER — Other Ambulatory Visit: Payer: Self-pay

## 2018-06-11 DIAGNOSIS — Q828 Other specified congenital malformations of skin: Secondary | ICD-10-CM | POA: Diagnosis not present

## 2018-06-11 DIAGNOSIS — B351 Tinea unguium: Secondary | ICD-10-CM

## 2018-06-11 DIAGNOSIS — M79676 Pain in unspecified toe(s): Secondary | ICD-10-CM | POA: Diagnosis not present

## 2018-06-11 DIAGNOSIS — M79672 Pain in left foot: Secondary | ICD-10-CM

## 2018-06-11 NOTE — Patient Instructions (Signed)

## 2018-06-19 ENCOUNTER — Encounter: Payer: Self-pay | Admitting: Podiatry

## 2018-06-19 NOTE — Progress Notes (Signed)
Subjective: Joel Howell presents today for painful mycotic toenail which he cannot cut and which interfere with daily activities.  Pain is aggravated when wearing enclosed shoe gear and relieved with periodic professional debridement.  Patient also c/o painful lesion plantar aspect of left foot. States it feels like he's walking on a pebble. This is also interfering with his ability to ambulate comfortably. He denies any drainage, redness, or swelling. He cannot reach the lesion to trim himself and is afraid he may injure himself.  Lavone Orn, MD is his PCP.   Current Outpatient Medications:  .  amLODipine (NORVASC) 5 MG tablet, Take 5 mg by mouth daily., Disp: , Rfl: 11 .  atropine 1 % ophthalmic solution, , Disp: , Rfl:  .  benazepril-hydrochlorthiazide (LOTENSIN HCT) 10-12.5 MG tablet, Take by mouth., Disp: , Rfl:  .  cholecalciferol (VITAMIN D) 1000 UNITS tablet, Take 1,000 Units by mouth daily., Disp: , Rfl:  .  dorzolamide-timolol (COSOPT) 22.3-6.8 MG/ML ophthalmic solution, , Disp: , Rfl:  .  DUREZOL 0.05 % EMUL, INSTILL 1 DROP INTO LEFT EYE 4 TIMES A DAY, Disp: , Rfl:  .  GAVILYTE-N WITH FLAVOR PACK 420 g solution, See admin instructions., Disp: , Rfl: 0 .  ketorolac (ACULAR) 0.5 % ophthalmic solution, INSTILL 1 DROP INTO LEFT EYE 4 TIMES A DAY, Disp: , Rfl: 4 .  metoCLOPramide (REGLAN) 10 MG tablet, Take 20 mg by mouth daily., Disp: , Rfl:  .  prednisoLONE acetate (PRED FORTE) 1 % ophthalmic suspension, INSTILL 1 DROP INTO LEFT EYE EVERY DAY, Disp: , Rfl:  .  predniSONE (DELTASONE) 20 MG tablet, TAKE 2 TABLETS BY MOUTH EVERY DAY FOR 7 DAYS, Disp: , Rfl:   Allergies  Allergen Reactions  . Penicillins Swelling and Rash    Swelling of tongue    Objective:  Vascular Examination: Capillary refill time immediate x 10 digits  Dorsalis pedis and Posterior tibial pulses palpable b/l  Digital hair present x 10 digits  Skin temperature gradient WNL b/l  Dermatological  Examination: Skin with normal turgor, texture and tone b/l  Toenails 1-5 b/l discolored, thick, dystrophic with subungual debris and pain with palpation to nailbeds due to thickness of nails.  Porokeratotic lesion submet head 3 left foot with tenderness to palpation. No erythema, no edema, no drainage, no flocculence.  Musculoskeletal: Muscle strength 5/5 to all LE muscle groups  No pain, crepitus or joint limitation noted with ROM.   Neurological: Sensation intact with 10 gram monofilament.  Vibratory sensation intact.  Assessment: 1. Painful onychomycosis toenails 1-5 b/l  2. Porokeratosis submet head 3 left foot 3. Left foot pain secondary to porokeratosis  Plan: 1. Toenails 1-5 b/l were debrided in length and girth without iatrogenic bleeding. Porokeratosis submet head 3 left foot pared and enucleated with sterile scalpel blade without incident. 2. Patient to continue soft, supportive shoe gear daily. 3. Patient to report any pedal injuries to medical professional immediately. 4. Follow up 3 months.  5. Patient/POA to call should there be a concern in the interim.

## 2018-09-08 DIAGNOSIS — Z961 Presence of intraocular lens: Secondary | ICD-10-CM | POA: Diagnosis not present

## 2018-09-08 DIAGNOSIS — H35352 Cystoid macular degeneration, left eye: Secondary | ICD-10-CM | POA: Diagnosis not present

## 2018-09-08 DIAGNOSIS — H401133 Primary open-angle glaucoma, bilateral, severe stage: Secondary | ICD-10-CM | POA: Diagnosis not present

## 2018-09-08 DIAGNOSIS — H2511 Age-related nuclear cataract, right eye: Secondary | ICD-10-CM | POA: Diagnosis not present

## 2018-09-10 ENCOUNTER — Ambulatory Visit: Payer: PPO | Admitting: Podiatry

## 2018-10-10 ENCOUNTER — Ambulatory Visit (INDEPENDENT_AMBULATORY_CARE_PROVIDER_SITE_OTHER): Payer: PPO | Admitting: Podiatry

## 2018-10-10 ENCOUNTER — Encounter: Payer: Self-pay | Admitting: Podiatry

## 2018-10-10 ENCOUNTER — Other Ambulatory Visit: Payer: Self-pay

## 2018-10-10 DIAGNOSIS — M79676 Pain in unspecified toe(s): Secondary | ICD-10-CM | POA: Diagnosis not present

## 2018-10-10 DIAGNOSIS — B351 Tinea unguium: Secondary | ICD-10-CM

## 2018-10-10 NOTE — Patient Instructions (Signed)
Corns and Calluses Corns are small areas of thickened skin that occur on the top, sides, or tip of a toe. They contain a cone-shaped core with a point that can press on a nerve below. This causes pain.  Calluses are areas of thickened skin that can occur anywhere on the body, including the hands, fingers, palms, soles of the feet, and heels. Calluses are usually larger than corns. What are the causes? Corns and calluses are caused by rubbing (friction) or pressure, such as from shoes that are too tight or do not fit properly. What increases the risk? Corns are more likely to develop in people who have misshapen toes (toe deformities), such as hammer toes. Calluses can occur with friction to any area of the skin. They are more likely to develop in people who:  Work with their hands.  Wear shoes that fit poorly, are too tight, or are high-heeled.  Have toe deformities. What are the signs or symptoms? Symptoms of a corn or callus include:  A hard growth on the skin.  Pain or tenderness under the skin.  Redness and swelling.  Increased discomfort while wearing tight-fitting shoes, if your feet are affected. If a corn or callus becomes infected, symptoms may include:  Redness and swelling that gets worse.  Pain.  Fluid, blood, or pus draining from the corn or callus. How is this diagnosed? Corns and calluses may be diagnosed based on your symptoms, your medical history, and a physical exam. How is this treated? Treatment for corns and calluses may include:  Removing the cause of the friction or pressure. This may involve: ? Changing your shoes. ? Wearing shoe inserts (orthotics) or other protective layers in your shoes, such as a corn pad. ? Wearing gloves.  Applying medicine to the skin (topical medicine) to help soften skin in the hardened, thickened areas.  Removing layers of dead skin with a file to reduce the size of the corn or callus.  Removing the corn or callus with a  scalpel or laser.  Taking antibiotic medicines, if your corn or callus is infected.  Having surgery, if a toe deformity is the cause. Follow these instructions at home:   Take over-the-counter and prescription medicines only as told by your health care provider.  If you were prescribed an antibiotic, take it as told by your health care provider. Do not stop taking it even if your condition starts to improve.  Wear shoes that fit well. Avoid wearing high-heeled shoes and shoes that are too tight or too loose.  Wear any padding, protective layers, gloves, or orthotics as told by your health care provider.  Soak your hands or feet and then use a file or pumice stone to soften your corn or callus. Do this as told by your health care provider.  Check your corn or callus every day for symptoms of infection. Contact a health care provider if you:  Notice that your symptoms do not improve with treatment.  Have redness or swelling that gets worse.  Notice that your corn or callus becomes painful.  Have fluid, blood, or pus coming from your corn or callus.  Have new symptoms. Summary  Corns are small areas of thickened skin that occur on the top, sides, or tip of a toe.  Calluses are areas of thickened skin that can occur anywhere on the body, including the hands, fingers, palms, and soles of the feet. Calluses are usually larger than corns.  Corns and calluses are caused by   rubbing (friction) or pressure, such as from shoes that are too tight or do not fit properly.  Treatment may include wearing any padding, protective layers, gloves, or orthotics as told by your health care provider. This information is not intended to replace advice given to you by your health care provider. Make sure you discuss any questions you have with your health care provider. Document Released: 12/17/2003 Document Revised: 07/02/2018 Document Reviewed: 01/23/2017 Elsevier Patient Education  2020 [employer]   Onychomycosis/Fungal Toenails  WHAT IS IT? An infection that lies within the keratin of your nail plate that is caused by a fungus.  WHY ME? Fungal infections affect all ages, sexes, races, and creeds.  There may be many factors that predispose you to a fungal infection such as age, coexisting medical conditions such as diabetes, or an autoimmune disease; stress, medications, fatigue, genetics, etc.  Bottom line: fungus thrives in a warm, moist environment and your shoes offer such a location.  IS IT CONTAGIOUS? Theoretically, yes.  You do not want to share shoes, nail clippers or files with someone who has fungal toenails.  Walking around barefoot in the same room or sleeping in the same bed is unlikely to transfer the organism.  It is important to realize, however, that fungus can spread easily from one nail to the next on the same foot.  HOW DO WE TREAT THIS?  There are several ways to treat this condition.  Treatment may depend on many factors such as age, medications, pregnancy, liver and kidney conditions, etc.  It is best to ask your doctor which options are available to you.  1. No treatment.   Unlike many other medical concerns, you can live with this condition.  However for many people this can be a painful condition and may lead to ingrown toenails or a bacterial infection.  It is recommended that you keep the nails cut short to help reduce the amount of fungal nail. 2. Topical treatment.  These range from herbal remedies to prescription strength nail lacquers.  About 40-50% effective, topicals require twice daily application for approximately 9 to 12 months or until an entirely new nail has grown out.  The most effective topicals are medical grade medications available through physicians offices. 3. Oral antifungal medications.  With an 80-90% cure rate, the most common oral medication requires 3 to 4 months of therapy and stays in your system for a year as the new nail grows out.   Oral antifungal medications do require blood work to make sure it is a safe drug for you.  A liver function panel will be performed prior to starting the medication and after the first month of treatment.  It is important to have the blood work performed to avoid any harmful side effects.  In general, this medication safe but blood work is required. 4. Laser Therapy.  This treatment is performed by applying a specialized laser to the affected nail plate.  This therapy is noninvasive, fast, and non-painful.  It is not covered by insurance and is therefore, out of pocket.  The results have been very good with a 80-95% cure rate.  [facility] is the only practice in the area to offer this therapy. 5. Permanent Nail Avulsion.  Removing the entire nail so that a new nail will not grow back. 

## 2018-10-13 NOTE — Progress Notes (Signed)
Subjective:  Joel Howell presents to clinic today with cc of  painful, thick, discolored, elongated toenails 1-5 b/l that become tender and cannot cut because of thickness. Pain is aggravated when wearing enclosed shoe gear.   Lavone Orn, MD is his PCP.    Current Outpatient Medications:  .  amLODipine (NORVASC) 5 MG tablet, Take 5 mg by mouth daily., Disp: , Rfl: 11 .  atropine 1 % ophthalmic solution, , Disp: , Rfl:  .  benazepril-hydrochlorthiazide (LOTENSIN HCT) 10-12.5 MG tablet, Take by mouth., Disp: , Rfl:  .  cholecalciferol (VITAMIN D) 1000 UNITS tablet, Take 1,000 Units by mouth daily., Disp: , Rfl:  .  clindamycin (CLEOCIN) 150 MG capsule, , Disp: , Rfl:  .  dorzolamide-timolol (COSOPT) 22.3-6.8 MG/ML ophthalmic solution, , Disp: , Rfl:  .  DUREZOL 0.05 % EMUL, INSTILL 1 DROP INTO LEFT EYE 4 TIMES A DAY, Disp: , Rfl:  .  GAVILYTE-N WITH FLAVOR PACK 420 g solution, See admin instructions., Disp: , Rfl: 0 .  ketorolac (ACULAR) 0.5 % ophthalmic solution, INSTILL 1 DROP INTO LEFT EYE 4 TIMES A DAY, Disp: , Rfl: 4 .  metoCLOPramide (REGLAN) 10 MG tablet, Take 20 mg by mouth daily., Disp: , Rfl:  .  prednisoLONE acetate (PRED FORTE) 1 % ophthalmic suspension, INSTILL 1 DROP INTO LEFT EYE EVERY DAY, Disp: , Rfl:  .  predniSONE (DELTASONE) 20 MG tablet, TAKE 2 TABLETS BY MOUTH EVERY DAY FOR 7 DAYS, Disp: , Rfl:    Allergies  Allergen Reactions  . Penicillins Swelling and Rash    Swelling of tongue     Objective: Physical Examination:  Vascular Examination: Capillary refill time immediate x 10 digits.  Palpable DP/PT pulses b/l.  Digital hair present b/l.  No edema noted b/l.  Skin temperature gradient WNL b/l.  Dermatological Examination: Skin with normal turgor, texture and tone b/l.  No open wounds b/l.  No interdigital macerations noted b/l.  Elongated, thick, discolored brittle toenails with subungual debris and pain on dorsal palpation of nailbeds 1-5  b/l.  Musculoskeletal Examination: Muscle strength 5/5 to all muscle groups b/l  No pain, crepitus or joint discomfort with active/passive ROM.  Neurological Examination: Sensation intact 5/5 b/l with 10 gram monofilament.  Vibratory sensation intact b/l.  Proprioceptive sensation intact b/l.  Assessment: Mycotic nail infection with pain 1-5 b/l  Plan: 1. Toenails 1-5 b/l were debrided in length and girth without iatrogenic laceration. 2.  Continue soft, supportive shoe gear daily. 3.  Report any pedal injuries to medical professional. 4.  Follow up 3 months. 5.  Patient/POA to call should there be a question/concern in there interim.

## 2018-10-23 DIAGNOSIS — H35352 Cystoid macular degeneration, left eye: Secondary | ICD-10-CM | POA: Diagnosis not present

## 2018-10-23 DIAGNOSIS — H2012 Chronic iridocyclitis, left eye: Secondary | ICD-10-CM | POA: Diagnosis not present

## 2018-10-23 DIAGNOSIS — H44112 Panuveitis, left eye: Secondary | ICD-10-CM | POA: Diagnosis not present

## 2018-10-23 DIAGNOSIS — H401123 Primary open-angle glaucoma, left eye, severe stage: Secondary | ICD-10-CM | POA: Diagnosis not present

## 2018-11-18 DIAGNOSIS — T3695XA Adverse effect of unspecified systemic antibiotic, initial encounter: Secondary | ICD-10-CM | POA: Diagnosis not present

## 2018-11-19 DIAGNOSIS — T3695XA Adverse effect of unspecified systemic antibiotic, initial encounter: Secondary | ICD-10-CM | POA: Diagnosis not present

## 2018-12-10 DIAGNOSIS — H401133 Primary open-angle glaucoma, bilateral, severe stage: Secondary | ICD-10-CM | POA: Diagnosis not present

## 2018-12-10 DIAGNOSIS — Z961 Presence of intraocular lens: Secondary | ICD-10-CM | POA: Diagnosis not present

## 2018-12-10 DIAGNOSIS — H35352 Cystoid macular degeneration, left eye: Secondary | ICD-10-CM | POA: Diagnosis not present

## 2018-12-10 DIAGNOSIS — H2511 Age-related nuclear cataract, right eye: Secondary | ICD-10-CM | POA: Diagnosis not present

## 2019-01-16 ENCOUNTER — Encounter: Payer: Self-pay | Admitting: Podiatry

## 2019-01-16 ENCOUNTER — Other Ambulatory Visit: Payer: Self-pay

## 2019-01-16 ENCOUNTER — Ambulatory Visit (INDEPENDENT_AMBULATORY_CARE_PROVIDER_SITE_OTHER): Payer: PPO | Admitting: Podiatry

## 2019-01-16 DIAGNOSIS — M79676 Pain in unspecified toe(s): Secondary | ICD-10-CM

## 2019-01-16 DIAGNOSIS — B351 Tinea unguium: Secondary | ICD-10-CM | POA: Diagnosis not present

## 2019-01-16 NOTE — Progress Notes (Signed)
Subjective: Joel Howell is seen today for follow up painful, elongated, thickened toenails 1-5 b/l feet that he cannot cut. Pain interferes with daily activities. Aggravating factor includes wearing enclosed shoe gear and relieved with periodic debridement.  Current Outpatient Medications on File Prior to Visit  Medication Sig  . amLODipine (NORVASC) 5 MG tablet Take 5 mg by mouth daily.  Marland Kitchen atropine 1 % ophthalmic solution   . benazepril-hydrochlorthiazide (LOTENSIN HCT) 10-12.5 MG tablet Take by mouth.  . cholecalciferol (VITAMIN D) 1000 UNITS tablet Take 1,000 Units by mouth daily.  . clindamycin (CLEOCIN) 150 MG capsule   . dorzolamide-timolol (COSOPT) 22.3-6.8 MG/ML ophthalmic solution   . DUREZOL 0.05 % EMUL INSTILL 1 DROP INTO LEFT EYE 4 TIMES A DAY  . GAVILYTE-N WITH FLAVOR PACK 420 g solution See admin instructions.  Marland Kitchen ketorolac (ACULAR) 0.4 % SOLN 1 drop  . ketorolac (ACULAR) 0.5 % ophthalmic solution INSTILL 1 DROP INTO LEFT EYE 4 TIMES A DAY  . metoCLOPramide (REGLAN) 10 MG tablet Take 20 mg by mouth daily.  . prednisoLONE acetate (PRED FORTE) 1 % ophthalmic suspension INSTILL 1 DROP INTO LEFT EYE EVERY DAY  . predniSONE (DELTASONE) 20 MG tablet TAKE 2 TABLETS BY MOUTH EVERY DAY FOR 7 DAYS  . vancomycin (VANCOCIN) 125 MG capsule Take 125 mg by mouth every 6 (six) hours.   No current facility-administered medications on file prior to visit.      Allergies  Allergen Reactions  . Penicillins Swelling and Rash    Swelling of tongue     Objective:  Vascular Examination: Capillary refill time immediate x 10 digits.  Dorsalis pedis present b/l.  Posterior tibial pulses present b/l.  Digital hair present x 10 digits.  Skin temperature gradient WNL b/l.   Dermatological Examination: Skin with normal turgor, texture and tone b/l.  Toenails 1-5 b/l discolored, thick, dystrophic with subungual debris and pain with palpation to nailbeds due to thickness of  nails.  Musculoskeletal: Muscle strength 5/5 to all LE muscle groups b/l.  HAV with bunion deformity b/l.  No pain, crepitus or joint limitation noted with ROM.   Neurological Examination: Protective sensation intact 5/5 b/l with 10 gram monofilament bilaterally.  Epicritic sensation present bilaterally.  Vibratory sensation intact bilaterally.   Assessment: Painful onychomycosis toenails 1-5 b/l   Plan: 1. Toenails 1-5 b/l were debrided in length and girth without iatrogenic bleeding. 2. Patient to continue soft, supportive shoe gear daily. 3. Patient to report any pedal injuries to medical professional immediately. 4. Follow up 3 months.  5. Patient/POA to call should there be a concern in the interim.

## 2019-01-16 NOTE — Patient Instructions (Signed)

## 2019-01-22 DIAGNOSIS — E7800 Pure hypercholesterolemia, unspecified: Secondary | ICD-10-CM | POA: Diagnosis not present

## 2019-01-22 DIAGNOSIS — Z0000 Encounter for general adult medical examination without abnormal findings: Secondary | ICD-10-CM | POA: Diagnosis not present

## 2019-01-22 DIAGNOSIS — Z1389 Encounter for screening for other disorder: Secondary | ICD-10-CM | POA: Diagnosis not present

## 2019-01-22 DIAGNOSIS — N529 Male erectile dysfunction, unspecified: Secondary | ICD-10-CM | POA: Diagnosis not present

## 2019-01-22 DIAGNOSIS — Z8546 Personal history of malignant neoplasm of prostate: Secondary | ICD-10-CM | POA: Diagnosis not present

## 2019-01-22 DIAGNOSIS — I739 Peripheral vascular disease, unspecified: Secondary | ICD-10-CM | POA: Diagnosis not present

## 2019-01-22 DIAGNOSIS — I10 Essential (primary) hypertension: Secondary | ICD-10-CM | POA: Diagnosis not present

## 2019-04-10 DIAGNOSIS — H35352 Cystoid macular degeneration, left eye: Secondary | ICD-10-CM | POA: Diagnosis not present

## 2019-04-10 DIAGNOSIS — H2511 Age-related nuclear cataract, right eye: Secondary | ICD-10-CM | POA: Diagnosis not present

## 2019-04-10 DIAGNOSIS — Z961 Presence of intraocular lens: Secondary | ICD-10-CM | POA: Diagnosis not present

## 2019-04-10 DIAGNOSIS — H401133 Primary open-angle glaucoma, bilateral, severe stage: Secondary | ICD-10-CM | POA: Diagnosis not present

## 2019-04-18 ENCOUNTER — Ambulatory Visit: Payer: PPO | Attending: Internal Medicine

## 2019-04-18 DIAGNOSIS — Z23 Encounter for immunization: Secondary | ICD-10-CM | POA: Insufficient documentation

## 2019-04-23 DIAGNOSIS — E7800 Pure hypercholesterolemia, unspecified: Secondary | ICD-10-CM | POA: Diagnosis not present

## 2019-04-23 DIAGNOSIS — H409 Unspecified glaucoma: Secondary | ICD-10-CM | POA: Diagnosis not present

## 2019-04-23 DIAGNOSIS — I10 Essential (primary) hypertension: Secondary | ICD-10-CM | POA: Diagnosis not present

## 2019-04-23 DIAGNOSIS — D563 Thalassemia minor: Secondary | ICD-10-CM | POA: Diagnosis not present

## 2019-04-23 DIAGNOSIS — J4520 Mild intermittent asthma, uncomplicated: Secondary | ICD-10-CM | POA: Diagnosis not present

## 2019-04-23 DIAGNOSIS — Z8546 Personal history of malignant neoplasm of prostate: Secondary | ICD-10-CM | POA: Diagnosis not present

## 2019-04-24 ENCOUNTER — Other Ambulatory Visit: Payer: Self-pay

## 2019-04-24 ENCOUNTER — Encounter: Payer: Self-pay | Admitting: Podiatry

## 2019-04-24 ENCOUNTER — Ambulatory Visit (INDEPENDENT_AMBULATORY_CARE_PROVIDER_SITE_OTHER): Payer: PPO | Admitting: Podiatry

## 2019-04-24 DIAGNOSIS — M79676 Pain in unspecified toe(s): Secondary | ICD-10-CM | POA: Diagnosis not present

## 2019-04-24 DIAGNOSIS — B351 Tinea unguium: Secondary | ICD-10-CM

## 2019-04-24 NOTE — Patient Instructions (Signed)

## 2019-04-25 NOTE — Progress Notes (Signed)
Subjective: Joel Howell presents today for follow up of painful mycotic nails b/l that are difficult to trim. Pain interferes with ambulation. Aggravating factors include wearing enclosed shoe gear. Pain is relieved with periodic professional debridement.   Allergies  Allergen Reactions  . Penicillins Swelling and Rash    Swelling of tongue    Objective: There were no vitals filed for this visit.  Vascular Examination:  Capillary refill time to digits immediate b/l, palpable DP pulses b/l, palpable PT pulses b/l, pedal hair present b/l and skin temperature gradient within normal limits b/l  Dermatological Examination: Pedal skin with normal turgor, texture and tone bilaterally, no open wounds bilaterally, no interdigital macerations bilaterally and toenails 1-5 b/l elongated, dystrophic, thickened, crumbly with subungual debris  Musculoskeletal: Normal muscle strength 5/5 to all lower extremity muscle groups bilaterally, no pain crepitus or joint limitation noted with ROM b/l and bunion deformity noted b/l  Neurological: Protective sensation intact 5/5 intact bilaterally with 10g monofilament b/l  Assessment: 1. Pain due to onychomycosis of toenail      Plan: -Toenails 1-5 b/l were debrided in length and girth without iatrogenic bleeding. -Patient to continue soft, supportive shoe gear daily. -Patient to report any pedal injuries to medical professional immediately. -Patient/POA to call should there be question/concern in the interim.  Return in about 3 months (around 07/23/2019) for nail trim.

## 2019-04-29 DIAGNOSIS — H35352 Cystoid macular degeneration, left eye: Secondary | ICD-10-CM | POA: Diagnosis not present

## 2019-04-29 DIAGNOSIS — H44112 Panuveitis, left eye: Secondary | ICD-10-CM | POA: Diagnosis not present

## 2019-04-29 DIAGNOSIS — H2012 Chronic iridocyclitis, left eye: Secondary | ICD-10-CM | POA: Diagnosis not present

## 2019-04-29 DIAGNOSIS — H401123 Primary open-angle glaucoma, left eye, severe stage: Secondary | ICD-10-CM | POA: Diagnosis not present

## 2019-05-16 ENCOUNTER — Ambulatory Visit: Payer: PPO

## 2019-05-23 ENCOUNTER — Ambulatory Visit: Payer: PPO | Attending: Internal Medicine

## 2019-05-23 DIAGNOSIS — Z23 Encounter for immunization: Secondary | ICD-10-CM

## 2019-05-23 NOTE — Progress Notes (Signed)
   Covid-19 Vaccination Clinic  Name:  Joel Howell    MRN: [id number] DOB: 08-22-1940  05/23/2019  Joel Howell was observed post Covid-19 immunization for 30 minutes based on pre-vaccination screening without incidence. He was provided with Vaccine Information Sheet and instruction to access the V-Safe system.   Joel Howell was instructed to call 911 with any severe reactions post vaccine: Marland Kitchen Difficulty breathing  . Swelling of your face and throat  . A fast heartbeat  . A bad rash all over your body  . Dizziness and weakness    Immunizations Administered    Name Date Dose VIS Date Route   Moderna COVID-19 Vaccine 05/23/2019  9:26 AM 0.5 mL 02/24/2019 Intramuscular   Manufacturer: Moderna   Lot: [id number]   [id number]

## 2019-06-16 DIAGNOSIS — I10 Essential (primary) hypertension: Secondary | ICD-10-CM | POA: Diagnosis not present

## 2019-06-16 DIAGNOSIS — J4520 Mild intermittent asthma, uncomplicated: Secondary | ICD-10-CM | POA: Diagnosis not present

## 2019-06-16 DIAGNOSIS — H409 Unspecified glaucoma: Secondary | ICD-10-CM | POA: Diagnosis not present

## 2019-06-16 DIAGNOSIS — E7800 Pure hypercholesterolemia, unspecified: Secondary | ICD-10-CM | POA: Diagnosis not present

## 2019-06-16 DIAGNOSIS — Z8546 Personal history of malignant neoplasm of prostate: Secondary | ICD-10-CM | POA: Diagnosis not present

## 2019-06-16 DIAGNOSIS — D563 Thalassemia minor: Secondary | ICD-10-CM | POA: Diagnosis not present

## 2019-06-29 DIAGNOSIS — I10 Essential (primary) hypertension: Secondary | ICD-10-CM | POA: Diagnosis not present

## 2019-07-10 DIAGNOSIS — H2511 Age-related nuclear cataract, right eye: Secondary | ICD-10-CM | POA: Diagnosis not present

## 2019-07-10 DIAGNOSIS — H35352 Cystoid macular degeneration, left eye: Secondary | ICD-10-CM | POA: Diagnosis not present

## 2019-07-10 DIAGNOSIS — H401133 Primary open-angle glaucoma, bilateral, severe stage: Secondary | ICD-10-CM | POA: Diagnosis not present

## 2019-07-10 DIAGNOSIS — Z961 Presence of intraocular lens: Secondary | ICD-10-CM | POA: Diagnosis not present

## 2019-07-23 DIAGNOSIS — E7800 Pure hypercholesterolemia, unspecified: Secondary | ICD-10-CM | POA: Diagnosis not present

## 2019-07-23 DIAGNOSIS — Z5181 Encounter for therapeutic drug level monitoring: Secondary | ICD-10-CM | POA: Diagnosis not present

## 2019-07-23 DIAGNOSIS — G5601 Carpal tunnel syndrome, right upper limb: Secondary | ICD-10-CM | POA: Diagnosis not present

## 2019-07-23 DIAGNOSIS — I10 Essential (primary) hypertension: Secondary | ICD-10-CM | POA: Diagnosis not present

## 2019-07-23 DIAGNOSIS — N529 Male erectile dysfunction, unspecified: Secondary | ICD-10-CM | POA: Diagnosis not present

## 2019-07-24 ENCOUNTER — Ambulatory Visit: Payer: PPO | Admitting: Podiatry

## 2019-07-24 DIAGNOSIS — I10 Essential (primary) hypertension: Secondary | ICD-10-CM | POA: Diagnosis not present

## 2019-09-24 ENCOUNTER — Ambulatory Visit: Payer: PPO | Admitting: Podiatry

## 2019-10-05 ENCOUNTER — Encounter: Payer: Self-pay | Admitting: Podiatry

## 2019-10-05 ENCOUNTER — Other Ambulatory Visit: Payer: Self-pay

## 2019-10-05 ENCOUNTER — Ambulatory Visit: Payer: PPO | Admitting: Podiatry

## 2019-10-05 DIAGNOSIS — M79676 Pain in unspecified toe(s): Secondary | ICD-10-CM

## 2019-10-05 DIAGNOSIS — M79641 Pain in right hand: Secondary | ICD-10-CM | POA: Diagnosis not present

## 2019-10-05 DIAGNOSIS — B351 Tinea unguium: Secondary | ICD-10-CM

## 2019-10-05 NOTE — Progress Notes (Signed)
Subjective:  Patient ID: Joel Howell, male    DOB: 1940/09/16,  MRN: [id number]  79 y.o. male presents with painful thick toenails that are difficult to trim. Pain interferes with ambulation. Aggravating factors include wearing enclosed shoe gear. Pain is relieved with periodic professional debridement.   Patient's PCP is Lavone Orn, MD [address] [place] [address]. Last visit was last month. He states Dr. Laurann Montana is referring him to a hand specialist for carpal tunnel of the right hand.  Review of Systems: Negative except as noted in the HPI.  Past Medical History:  Diagnosis Date  . Asthma    child ,none in adult years  . Blood dyscrasia    ? was told of something, but can't remember-no precautions given  . Cancer [facility]) 09-12-12   Prostate cancer '01-surgery only open  . Glaucoma    left eye, surgery done on right   Past Surgical History:  Procedure Laterality Date  . COLONOSCOPY WITH PROPOFOL N/A 09/30/2012   Procedure: COLONOSCOPY WITH PROPOFOL;  Surgeon: Garlan Fair, MD;  Location: [facility];  Service: Endoscopy;  Laterality: N/A;  . EYE SURGERY Right    for glaucoma  . JOINT REPLACEMENT Right rt. knee   RTKA age 67  . PROSTATE SURGERY    . TONSILLECTOMY     Patient Active Problem List   Diagnosis Date Noted  . Paroxysmal dyspnea 04/26/2015  . Arrhythmia 04/26/2015  . Absolute glaucoma 01/08/2011  . Primary open angle glaucoma 01/08/2011  . Primary open-angle glaucoma(365.11) 01/08/2011  . Glaucoma 01/02/2011  . 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  amLODipine (NORVASC) 10 MG tablet, Take 10 mg by mouth daily., Disp: , Rfl:  .  amLODipine (NORVASC) 5 MG tablet, Take 5 mg by mouth daily., Disp: , Rfl: 11 .  atorvastatin (LIPITOR) 20 MG tablet, Take 20 mg by mouth daily., Disp: , Rfl:  .  atropine 1 % ophthalmic solution, , Disp: , Rfl:  .  benazepril-hydrochlorthiazide (LOTENSIN HCT) 10-12.5 MG tablet, Take by  mouth., Disp: , Rfl:  .  cholecalciferol (VITAMIN D) 1000 UNITS tablet, Take 1,000 Units by mouth daily., Disp: , Rfl:  .  clindamycin (CLEOCIN) 150 MG capsule, , Disp: , Rfl:  .  dorzolamide-timolol (COSOPT) 22.3-6.8 MG/ML ophthalmic solution, , Disp: , Rfl:  .  DUREZOL 0.05 % EMUL, INSTILL 1 DROP INTO LEFT EYE 4 TIMES A DAY, Disp: , Rfl:  .  FLUAD QUADRIVALENT 0.5 ML injection, , Disp: , Rfl:  .  GAVILYTE-N WITH FLAVOR PACK 420 g solution, See admin instructions., Disp: , Rfl: 0 .  ketorolac (ACULAR) 0.4 % SOLN, 1 drop, Disp: , Rfl:  .  ketorolac (ACULAR) 0.5 % ophthalmic solution, INSTILL 1 DROP INTO LEFT EYE 4 TIMES A DAY, Disp: , Rfl: 4 .  metoCLOPramide (REGLAN) 10 MG tablet, Take 20 mg by mouth daily., Disp: , Rfl:  .  prednisoLONE acetate (PRED FORTE) 1 % ophthalmic suspension, INSTILL 1 DROP INTO LEFT EYE EVERY DAY, Disp: , Rfl:  .  predniSONE (DELTASONE) 20 MG tablet, TAKE 2 TABLETS BY MOUTH EVERY DAY FOR 7 DAYS, Disp: , Rfl:  .  tadalafil (CIALIS) 20 MG tablet, SMARTSIG:1 Tablet(s) By Mouth Every 3 Days PRN, Disp: , Rfl:  .  vancomycin (VANCOCIN) 125 MG capsule, Take 125 mg by mouth every 6 (six) hours., Disp: , Rfl:  Allergies  Allergen Reactions  . Penicillins Swelling and Rash    Swelling  of tongue   Social History   Tobacco Use  Smoking Status Former Smoker  . Packs/day: 0.50  . Quit date: 09/12/1981  . Years since quitting: 38.0  Smokeless Tobacco Never Used    Objective:   Constitutional Pt is a pleasant 79 y.o. African American male, WD, WN in NAD.Marland Kitchen AAO x 3.   Vascular Capillary refill time to digits immediate b/l. Palpable pedal pulses b/l LE. Pedal hair present. Lower extremity skin temperature gradient within normal limits. No cyanosis or clubbing noted.  Neurologic Normal speech. Oriented to person, place, and time. Protective sensation intact 5/5 intact bilaterally with 10g monofilament b/l. Vibratory sensation intact b/l. Proprioception intact bilaterally.    Dermatologic Pedal skin is thin shiny, atrophic b/l lower extremities. No open wounds bilaterally. No interdigital macerations bilaterally. Toenails L hallux, L 2nd toe, L 3rd toe, L 4th toe, R hallux, R 2nd toe, R 3rd toe and R 4th toe elongated, discolored, dystrophic, thickened, and crumbly with subungual debris and tenderness to dorsal palpation. Anonychia noted L 5th toe and R 5th toe. Nailbed(s) epithelialized.   Orthopedic: Normal muscle strength 5/5 to all lower extremity muscle groups bilaterally. No pain crepitus or joint limitation noted with ROM b/l. 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    Radiographs: None Assessment:   1. Pain due to onychomycosis of toenail    Plan:  Patient was evaluated and treated and all questions answered.  Onychomycosis with pain -Nails palliatively debridement as below. -Educated on self-care  Procedure: Nail Debridement Rationale: Pain Type of Debridement: manual, sharp debridement. Instrumentation: Nail nipper, rotary burr. Number of Nails: 8  -Examined patient. -Toenails L hallux, L 2nd toe, L 3rd toe, L 4th toe, R hallux, R 2nd toe, R 3rd toe and R 4th toe debrided in length and girth without iatrogenic bleeding with sterile nail nipper and dremel.  -Patient to report any pedal injuries to medical professional immediately. -Patient to continue soft, supportive shoe gear daily. -Patient/POA to call should there be question/concern in the interim.  Return in about 3 months (around 01/05/2020) for nail and callus trim.  Marzetta Board, DPM

## 2019-10-27 ENCOUNTER — Encounter (INDEPENDENT_AMBULATORY_CARE_PROVIDER_SITE_OTHER): Payer: PPO | Admitting: Ophthalmology

## 2019-11-03 ENCOUNTER — Encounter (INDEPENDENT_AMBULATORY_CARE_PROVIDER_SITE_OTHER): Payer: Self-pay | Admitting: Ophthalmology

## 2019-11-03 ENCOUNTER — Other Ambulatory Visit: Payer: Self-pay

## 2019-11-03 ENCOUNTER — Ambulatory Visit (INDEPENDENT_AMBULATORY_CARE_PROVIDER_SITE_OTHER): Payer: PPO | Admitting: Ophthalmology

## 2019-11-03 DIAGNOSIS — H44112 Panuveitis, left eye: Secondary | ICD-10-CM

## 2019-11-03 DIAGNOSIS — H401123 Primary open-angle glaucoma, left eye, severe stage: Secondary | ICD-10-CM | POA: Diagnosis not present

## 2019-11-03 DIAGNOSIS — H35352 Cystoid macular degeneration, left eye: Secondary | ICD-10-CM | POA: Diagnosis not present

## 2019-11-03 NOTE — Patient Instructions (Signed)
Patient to notify the office should new vision decline distortion develop in the left eye

## 2019-11-03 NOTE — Assessment & Plan Note (Signed)
This condition has been present in the past and recurrent, now stabilized on topical Durezol once daily and topical NSAID twice daily

## 2019-11-03 NOTE — Progress Notes (Signed)
11/03/2019     CHIEF COMPLAINT Patient presents for Retina Follow Up   HISTORY OF PRESENT ILLNESS: Joel Howell is a 79 y.o. male who presents to the clinic today for:   HPI    Retina Follow Up    Patient presents with  Other.  In left eye.  Duration of 6 months.  Since onset it is stable.          Comments    6 month follow up - FP OU Patient denies change in vision and overall has no complaints.        Last edited by Gerda Diss on 11/03/2019  9:12 AM. (History)      Referring physician: Lavone Orn, MD [address] [place],  [place] [address]  HISTORICAL INFORMATION:   Selected notes from the MEDICAL RECORD NUMBER       CURRENT MEDICATIONS: Current Outpatient Medications (Ophthalmic Drugs)  Medication Sig  . atropine 1 % ophthalmic solution   . dorzolamide-timolol (COSOPT) 22.3-6.8 MG/ML ophthalmic solution   . DUREZOL 0.05 % EMUL INSTILL 1 DROP INTO LEFT EYE 4 TIMES A DAY  . ketorolac (ACULAR) 0.4 % SOLN 1 drop  . ketorolac (ACULAR) 0.5 % ophthalmic solution INSTILL 1 DROP INTO LEFT EYE 4 TIMES A DAY  . prednisoLONE acetate (PRED FORTE) 1 % ophthalmic suspension INSTILL 1 DROP INTO LEFT EYE EVERY DAY   No current facility-administered medications for this visit. (Ophthalmic Drugs)   Current Outpatient Medications (Other)  Medication Sig  . amLODipine (NORVASC) 10 MG tablet Take 10 mg by mouth daily.  Marland Kitchen amLODipine (NORVASC) 5 MG tablet Take 5 mg by mouth daily.  Marland Kitchen atorvastatin (LIPITOR) 20 MG tablet Take 20 mg by mouth daily.  . benazepril-hydrochlorthiazide (LOTENSIN HCT) 10-12.5 MG tablet Take by mouth.  . cholecalciferol (VITAMIN D) 1000 UNITS tablet Take 1,000 Units by mouth daily.  . clindamycin (CLEOCIN) 150 MG capsule   . FLUAD QUADRIVALENT 0.5 ML injection   . GAVILYTE-N WITH FLAVOR PACK 420 g solution See admin instructions.  . metoCLOPramide (REGLAN) 10 MG tablet Take 20 mg by mouth daily.  . predniSONE (DELTASONE) 20 MG  tablet TAKE 2 TABLETS BY MOUTH EVERY DAY FOR 7 DAYS  . tadalafil (CIALIS) 20 MG tablet SMARTSIG:1 Tablet(s) By Mouth Every 3 Days PRN  . vancomycin (VANCOCIN) 125 MG capsule Take 125 mg by mouth every 6 (six) hours.   No current facility-administered medications for this visit. (Other)      REVIEW OF SYSTEMS:    ALLERGIES Allergies  Allergen Reactions  . Penicillins Swelling and Rash    Swelling of tongue    PAST MEDICAL HISTORY Past Medical History:  Diagnosis Date  . Asthma    child ,none in adult years  . Blood dyscrasia    ? was told of something, but can't remember-no precautions given  . Cancer [facility]) 09-12-12   Prostate cancer '01-surgery only open  . Glaucoma    left eye, surgery done on right   Past Surgical History:  Procedure Laterality Date  . COLONOSCOPY WITH PROPOFOL N/A 09/30/2012   Procedure: COLONOSCOPY WITH PROPOFOL;  Surgeon: Garlan Fair, MD;  Location: [facility];  Service: Endoscopy;  Laterality: N/A;  . EYE SURGERY Right    for glaucoma  . JOINT REPLACEMENT Right rt. knee   RTKA age 74  . PROSTATE SURGERY    . TONSILLECTOMY      FAMILY HISTORY History reviewed. No pertinent family history.  SOCIAL HISTORY Social History   Tobacco Use  . Smoking status: Former Smoker    Packs/day: 0.50    Quit date: 09/12/1981    Years since quitting: 38.1  . Smokeless tobacco: Never Used  Substance Use Topics  . Alcohol use: Yes    Comment: social weekends  . Drug use: No    Types: Marijuana    Comment: past hx social weekends-none in 30 yrs         OPHTHALMIC EXAM:  Base Eye Exam    Visual Acuity (Snellen - Linear)      Right Left   Dist cc NLP 20/20   Correction: Glasses       Tonometry (Tonopen, 9:17 AM)      Right Left   Pressure 25 11       Pupils      Dark Light Shape React APD   Right        Left 4 3 Round Brisk None  No view OD       Visual Fields (Counting fingers)      Left Right    Full    Restrictions   Total superior temporal, inferior temporal, superior nasal, inferior nasal deficiencies       Extraocular Movement      Right Left    Full Full       Neuro/Psych    Oriented x3: Yes   Mood/Affect: Normal       Dilation    Left eye: 1.0% Mydriacyl, 2.5% Phenylephrine @ 9:17 AM        Slit Lamp and Fundus Exam    External Exam      Right Left   External Normal Normal       Slit Lamp Exam      Right Left   Lids/Lashes Normal Normal   Conjunctiva/Sclera White and quiet White and quiet   Cornea Clear Clear   Anterior Chamber Deep and quiet Deep and quiet   Iris Round and reactive Round and reactive   Lens Posterior chamber intraocular lens Posterior chamber intraocular lens   Anterior Vitreous Normal Normal       Fundus Exam      Right Left   Posterior Vitreous no view Normal   Disc  Normal   C/D Ratio  0.75   Macula  Normal   Vessels  Normal   Periphery  Normal          IMAGING AND PROCEDURES  Imaging and Procedures for 11/03/19  Color Fundus Photography Optos - OU - Both Eyes       Left Eye Progression has been stable. Disc findings include increased cup to disc ratio, pallor. Macula : normal observations. Vessels : normal observations. Periphery : normal observations.   Notes No view OD  History of uveitis glaucoma, now stabilized as inflammation is stabilized                ASSESSMENT/PLAN:  Cystoid macular edema of left eye This condition has been present in the past and recurrent, now stabilized on topical Durezol once daily and topical NSAID twice daily  Primary open angle glaucoma of left eye, severe stage This condition continues under the care and management of Dr. Warden Fillers      ICD-10-CM   1. Cystoid macular edema of left eye  H35.352 Color Fundus Photography Optos - OU - Both Eyes  2. Primary open angle glaucoma of left eye, severe stage  H40.1123 Color Fundus Photography  Optos - OU - Both Eyes  3. 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  Patient instructed to continue on the Durezol once daily left eye as well as topical ketorolac left eye twice daily  2.  Patient still uses atropine once daily right eye to control ocular pain from blind eye.  3.  OD, ocular surface is scarred yet intact  Follow-up with Dr. Warden Fillers as scheduled for glaucoma management OS  Ophthalmic Meds Ordered this visit:  No orders of the defined types were placed in this encounter.      Return in about 6 months (around 05/05/2020) for OCT, dilate, OS.  Patient Instructions  Patient to notify the office should new vision decline distortion develop in the left eye    Explained the diagnoses, plan, and follow up with the patient and they expressed understanding.  Patient expressed understanding of the importance of proper follow up care.   Clent Demark Yasmen Cortner M.D. Diseases & Surgery of the Retina and Vitreous [facility] [place] 11/03/19     Abbreviations: M myopia (nearsighted); A astigmatism; H hyperopia (farsighted); P presbyopia; Mrx spectacle prescription;  CTL contact lenses; OD right eye; OS left eye; OU both eyes  XT exotropia; ET esotropia; PEK punctate epithelial keratitis; PEE punctate epithelial erosions; DES dry eye syndrome; MGD meibomian gland dysfunction; ATs artificial tears; PFAT's preservative free artificial tears; [place] nuclear sclerotic cataract; PSC posterior subcapsular cataract; ERM epi-retinal membrane; PVD posterior vitreous detachment; RD retinal detachment; DM diabetes mellitus; DR diabetic retinopathy; NPDR non-proliferative diabetic retinopathy; PDR proliferative diabetic retinopathy; CSME clinically significant macular edema; DME diabetic macular edema; dbh dot blot hemorrhages; CWS cotton wool spot; POAG primary open angle glaucoma; C/D cup-to-disc ratio; HVF humphrey visual field; GVF goldmann visual field; OCT optical coherence tomography; IOP intraocular pressure; BRVO Branch  retinal vein occlusion; CRVO central retinal vein occlusion; CRAO central retinal artery occlusion; BRAO branch retinal artery occlusion; RT retinal tear; SB scleral buckle; PPV pars plana vitrectomy; VH Vitreous hemorrhage; PRP panretinal laser photocoagulation; IVK intravitreal kenalog; VMT vitreomacular traction; MH Macular hole;  NVD neovascularization of the disc; NVE neovascularization elsewhere; AREDS age related eye disease study; ARMD age related macular degeneration; POAG primary open angle glaucoma; EBMD epithelial/anterior basement membrane dystrophy; ACIOL anterior chamber intraocular lens; IOL intraocular lens; PCIOL posterior chamber intraocular lens; Phaco/IOL phacoemulsification with intraocular lens placement; [place] photorefractive keratectomy; LASIK laser assisted in situ keratomileusis; HTN hypertension; DM diabetes mellitus; COPD chronic obstructive pulmonary disease

## 2019-11-03 NOTE — Assessment & Plan Note (Signed)
This condition continues under the care and management of Dr. Warden Fillers

## 2019-11-04 DIAGNOSIS — H0102B Squamous blepharitis left eye, upper and lower eyelids: Secondary | ICD-10-CM | POA: Diagnosis not present

## 2019-11-04 DIAGNOSIS — H0102A Squamous blepharitis right eye, upper and lower eyelids: Secondary | ICD-10-CM | POA: Diagnosis not present

## 2019-11-10 DIAGNOSIS — H0102B Squamous blepharitis left eye, upper and lower eyelids: Secondary | ICD-10-CM | POA: Diagnosis not present

## 2019-11-10 DIAGNOSIS — H401133 Primary open-angle glaucoma, bilateral, severe stage: Secondary | ICD-10-CM | POA: Diagnosis not present

## 2019-11-10 DIAGNOSIS — H35352 Cystoid macular degeneration, left eye: Secondary | ICD-10-CM | POA: Diagnosis not present

## 2019-11-10 DIAGNOSIS — Z961 Presence of intraocular lens: Secondary | ICD-10-CM | POA: Diagnosis not present

## 2019-11-10 DIAGNOSIS — H0102A Squamous blepharitis right eye, upper and lower eyelids: Secondary | ICD-10-CM | POA: Diagnosis not present

## 2019-11-10 DIAGNOSIS — H2511 Age-related nuclear cataract, right eye: Secondary | ICD-10-CM | POA: Diagnosis not present

## 2019-11-23 DIAGNOSIS — I10 Essential (primary) hypertension: Secondary | ICD-10-CM | POA: Diagnosis not present

## 2019-11-24 DIAGNOSIS — I10 Essential (primary) hypertension: Secondary | ICD-10-CM | POA: Diagnosis not present

## 2019-12-24 DIAGNOSIS — I10 Essential (primary) hypertension: Secondary | ICD-10-CM | POA: Diagnosis not present

## 2020-01-11 ENCOUNTER — Ambulatory Visit: Payer: PPO | Admitting: Podiatry

## 2020-01-11 ENCOUNTER — Encounter: Payer: Self-pay | Admitting: Podiatry

## 2020-01-11 ENCOUNTER — Other Ambulatory Visit: Payer: Self-pay

## 2020-01-11 DIAGNOSIS — M79676 Pain in unspecified toe(s): Secondary | ICD-10-CM | POA: Diagnosis not present

## 2020-01-11 DIAGNOSIS — Q828 Other specified congenital malformations of skin: Secondary | ICD-10-CM

## 2020-01-11 DIAGNOSIS — M79672 Pain in left foot: Secondary | ICD-10-CM

## 2020-01-11 DIAGNOSIS — B351 Tinea unguium: Secondary | ICD-10-CM

## 2020-01-11 DIAGNOSIS — M79671 Pain in right foot: Secondary | ICD-10-CM

## 2020-01-14 NOTE — Progress Notes (Signed)
Subjective:  Patient ID: Joel Howell, male    DOB: 1941-02-03,  MRN: [id number]  Joel Howell presents to clinic today for painful thick toenails that are difficult to trim. Pain interferes with ambulation. Aggravating factors include wearing enclosed shoe gear. Pain is relieved with periodic professional debridement..  79 y.o. male presents with the above complaint.    Review of Systems: Negative except as noted in the HPI. Past Medical History:  Diagnosis Date  . Asthma    child ,none in adult years  . Blood dyscrasia    ? was told of something, but can't remember-no precautions given  . Cancer [facility]) 09-12-12   Prostate cancer '01-surgery only open  . Glaucoma    left eye, surgery done on right   Past Surgical History:  Procedure Laterality Date  . COLONOSCOPY WITH PROPOFOL N/A 09/30/2012   Procedure: COLONOSCOPY WITH PROPOFOL;  Surgeon: Garlan Fair, MD;  Location: [facility];  Service: Endoscopy;  Laterality: N/A;  . EYE SURGERY Right    for glaucoma  . JOINT REPLACEMENT Right rt. knee   RTKA age 2  . PROSTATE SURGERY    . TONSILLECTOMY      Current Outpatient Medications:  .  amLODipine (NORVASC) 10 MG tablet, Take 10 mg by mouth daily., Disp: , Rfl:  .  amLODipine (NORVASC) 5 MG tablet, Take 5 mg by mouth daily., Disp: , Rfl: 11 .  atorvastatin (LIPITOR) 20 MG tablet, Take 20 mg by mouth daily., Disp: , Rfl:  .  atropine 1 % ophthalmic solution, , Disp: , Rfl:  .  benazepril-hydrochlorthiazide (LOTENSIN HCT) 10-12.5 MG tablet, Take by mouth., Disp: , Rfl:  .  cholecalciferol (VITAMIN D) 1000 UNITS tablet, Take 1,000 Units by mouth daily., Disp: , Rfl:  .  clindamycin (CLEOCIN) 150 MG capsule, , Disp: , Rfl:  .  dorzolamide-timolol (COSOPT) 22.3-6.8 MG/ML ophthalmic solution, , Disp: , Rfl:  .  DUREZOL 0.05 % EMUL, INSTILL 1 DROP INTO LEFT EYE 4 TIMES A DAY, Disp: , Rfl:  .  erythromycin ophthalmic ointment, SMARTSIG:In Eye(s), Disp: , Rfl:  .  FLUAD  QUADRIVALENT 0.5 ML injection, , Disp: , Rfl:  .  GAVILYTE-N WITH FLAVOR PACK 420 g solution, See admin instructions., Disp: , Rfl: 0 .  ketorolac (ACULAR) 0.4 % SOLN, 1 drop, Disp: , Rfl:  .  ketorolac (ACULAR) 0.5 % ophthalmic solution, INSTILL 1 DROP INTO LEFT EYE 4 TIMES A DAY, Disp: , Rfl: 4 .  metoCLOPramide (REGLAN) 10 MG tablet, Take 20 mg by mouth daily., Disp: , Rfl:  .  prednisoLONE acetate (PRED FORTE) 1 % ophthalmic suspension, INSTILL 1 DROP INTO LEFT EYE EVERY DAY, Disp: , Rfl:  .  predniSONE (DELTASONE) 20 MG tablet, TAKE 2 TABLETS BY MOUTH EVERY DAY FOR 7 DAYS, Disp: , Rfl:  .  tadalafil (CIALIS) 20 MG tablet, SMARTSIG:1 Tablet(s) By Mouth Every 3 Days PRN, Disp: , Rfl:  .  vancomycin (VANCOCIN) 125 MG capsule, Take 125 mg by mouth every 6 (six) hours., Disp: , Rfl:  Allergies  Allergen Reactions  . Penicillins Swelling and Rash    Swelling of tongue   Social History   Occupational History  . Not on file  Tobacco Use  . Smoking status: Former Smoker    Packs/day: 0.50    Quit date: 09/12/1981    Years since quitting: 38.3  . Smokeless tobacco: Never Used  Substance and Sexual Activity  . Alcohol use: Yes    Comment:  social weekends  . Drug use: No    Types: Marijuana    Comment: past hx social weekends-none in 30 yrs  . Sexual activity: Yes    Objective:   Constitutional Joel Howell is a pleasant 79 y.o. African American male, WD, WN in NAD. AAO x 3.   Vascular Capillary refill time to digits immediate b/l. Palpable pedal pulses b/l LE. Pedal hair present. Lower extremity skin temperature gradient within normal limits. No cyanosis or clubbing noted.  Neurologic Normal speech. Oriented to person, place, and time. Protective sensation intact 5/5 intact bilaterally with 10g monofilament b/l. Vibratory sensation intact b/l.  Dermatologic Pedal skin is thin shiny, atrophic b/l lower extremities. No open wounds bilaterally. No interdigital macerations bilaterally.  Toenails L hallux, L 2nd toe, L 3rd toe, L 4th toe, R hallux, R 2nd toe, R 3rd toe and R 4th toe elongated, discolored, dystrophic, thickened, and crumbly with subungual debris and tenderness to dorsal palpation. Anonychia noted L 5th toe and R 5th toe. Nailbed(s) epithelialized.  Porokeratotic lesion(s) R hallux and submet head 2 left foot. No erythema, no edema, no drainage, no flocculence.  Orthopedic: Normal muscle strength 5/5 to all lower extremity muscle groups bilaterally. No pain crepitus or joint limitation noted with ROM b/l. 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   Radiographs: None Assessment:   1. Pain due to onychomycosis of toenail   2. Porokeratosis   3. Bilateral foot pain    Plan:  Patient was evaluated and treated and all questions answered.  Onychomycosis with pain -Nails palliatively debridement as below -Educated on self-care  Procedure: Nail Debridement Rationale: Pain Type of Debridement: manual, sharp debridement. Instrumentation: Nail nipper, rotary burr. Number of Nails: 10 -Examined patient. -Toenails 1-5 b/l were debrided in length and girth with sterile nail nippers and dremel without iatrogenic bleeding.  -Painful porokeratotic lesion(s) R hallux and submet head 2 left foot pared and enucleated with sterile scalpel blade without incident. -Patient to report any pedal injuries to medical professional immediately. -Patient to continue soft, supportive shoe gear daily. -Patient/POA to call should there be question/concern in the interim.  Return in about 3 months (around 04/12/2020).  Marzetta Board, DPM

## 2020-01-19 DIAGNOSIS — I10 Essential (primary) hypertension: Secondary | ICD-10-CM | POA: Diagnosis not present

## 2020-01-22 DIAGNOSIS — I10 Essential (primary) hypertension: Secondary | ICD-10-CM | POA: Diagnosis not present

## 2020-01-26 DIAGNOSIS — Z0000 Encounter for general adult medical examination without abnormal findings: Secondary | ICD-10-CM | POA: Diagnosis not present

## 2020-01-26 DIAGNOSIS — Z1389 Encounter for screening for other disorder: Secondary | ICD-10-CM | POA: Diagnosis not present

## 2020-01-26 DIAGNOSIS — Z23 Encounter for immunization: Secondary | ICD-10-CM | POA: Diagnosis not present

## 2020-01-26 DIAGNOSIS — Z8546 Personal history of malignant neoplasm of prostate: Secondary | ICD-10-CM | POA: Diagnosis not present

## 2020-01-26 DIAGNOSIS — I10 Essential (primary) hypertension: Secondary | ICD-10-CM | POA: Diagnosis not present

## 2020-01-26 DIAGNOSIS — E7800 Pure hypercholesterolemia, unspecified: Secondary | ICD-10-CM | POA: Diagnosis not present

## 2020-01-26 DIAGNOSIS — I739 Peripheral vascular disease, unspecified: Secondary | ICD-10-CM | POA: Diagnosis not present

## 2020-01-26 DIAGNOSIS — J4520 Mild intermittent asthma, uncomplicated: Secondary | ICD-10-CM | POA: Diagnosis not present

## 2020-01-26 DIAGNOSIS — N529 Male erectile dysfunction, unspecified: Secondary | ICD-10-CM | POA: Diagnosis not present

## 2020-02-22 DIAGNOSIS — I10 Essential (primary) hypertension: Secondary | ICD-10-CM | POA: Diagnosis not present

## 2020-02-23 DIAGNOSIS — I10 Essential (primary) hypertension: Secondary | ICD-10-CM | POA: Diagnosis not present

## 2020-03-12 ENCOUNTER — Other Ambulatory Visit (INDEPENDENT_AMBULATORY_CARE_PROVIDER_SITE_OTHER): Payer: Self-pay | Admitting: Ophthalmology

## 2020-03-23 DIAGNOSIS — I10 Essential (primary) hypertension: Secondary | ICD-10-CM | POA: Diagnosis not present

## 2020-03-24 DIAGNOSIS — I10 Essential (primary) hypertension: Secondary | ICD-10-CM | POA: Diagnosis not present

## 2020-03-28 ENCOUNTER — Other Ambulatory Visit (HOSPITAL_COMMUNITY): Payer: Self-pay | Admitting: Orthopedic Surgery

## 2020-03-28 DIAGNOSIS — M79641 Pain in right hand: Secondary | ICD-10-CM | POA: Diagnosis not present

## 2020-03-28 DIAGNOSIS — M79605 Pain in left leg: Secondary | ICD-10-CM | POA: Diagnosis not present

## 2020-03-28 DIAGNOSIS — M79604 Pain in right leg: Secondary | ICD-10-CM | POA: Diagnosis not present

## 2020-03-28 DIAGNOSIS — M79661 Pain in right lower leg: Secondary | ICD-10-CM

## 2020-03-28 DIAGNOSIS — M7989 Other specified soft tissue disorders: Secondary | ICD-10-CM

## 2020-03-29 ENCOUNTER — Other Ambulatory Visit: Payer: Self-pay

## 2020-03-29 ENCOUNTER — Ambulatory Visit (HOSPITAL_COMMUNITY)
Admission: RE | Admit: 2020-03-29 | Discharge: 2020-03-29 | Disposition: A | Discharge status: Home / Self Care | Payer: PPO | Source: Ambulatory Visit | Attending: Cardiovascular Disease | Admitting: Cardiovascular Disease

## 2020-03-29 DIAGNOSIS — M79661 Pain in right lower leg: Secondary | ICD-10-CM

## 2020-04-18 ENCOUNTER — Ambulatory Visit: Payer: PPO | Admitting: Podiatry

## 2020-04-18 DIAGNOSIS — H0102A Squamous blepharitis right eye, upper and lower eyelids: Secondary | ICD-10-CM | POA: Diagnosis not present

## 2020-04-18 DIAGNOSIS — H0102B Squamous blepharitis left eye, upper and lower eyelids: Secondary | ICD-10-CM | POA: Diagnosis not present

## 2020-04-18 DIAGNOSIS — H401133 Primary open-angle glaucoma, bilateral, severe stage: Secondary | ICD-10-CM | POA: Diagnosis not present

## 2020-04-18 DIAGNOSIS — H2511 Age-related nuclear cataract, right eye: Secondary | ICD-10-CM | POA: Diagnosis not present

## 2020-04-18 DIAGNOSIS — Z961 Presence of intraocular lens: Secondary | ICD-10-CM | POA: Diagnosis not present

## 2020-04-18 DIAGNOSIS — H35352 Cystoid macular degeneration, left eye: Secondary | ICD-10-CM | POA: Diagnosis not present

## 2020-04-25 DIAGNOSIS — I10 Essential (primary) hypertension: Secondary | ICD-10-CM | POA: Diagnosis not present

## 2020-05-05 ENCOUNTER — Other Ambulatory Visit: Payer: Self-pay

## 2020-05-05 ENCOUNTER — Encounter (INDEPENDENT_AMBULATORY_CARE_PROVIDER_SITE_OTHER): Payer: Self-pay | Admitting: Ophthalmology

## 2020-05-05 ENCOUNTER — Ambulatory Visit (INDEPENDENT_AMBULATORY_CARE_PROVIDER_SITE_OTHER): Payer: PPO | Admitting: Ophthalmology

## 2020-05-05 DIAGNOSIS — H35352 Cystoid macular degeneration, left eye: Secondary | ICD-10-CM | POA: Diagnosis not present

## 2020-05-05 NOTE — Progress Notes (Signed)
05/05/2020     CHIEF COMPLAINT Patient presents for Retina Follow Up (6 Month CME f\u OS. OCT/Pt states vision is about the same. Pt noticed green spots in vision yesterday. Pt has not seen them again since early this AM.Pt using gtts OS as directed.)   HISTORY OF PRESENT ILLNESS: Joel Howell is a 80 y.o. male who presents to the clinic today for:   HPI    Retina Follow Up    Diagnosis: CME.  In left eye.  Severity is moderate.  Duration of 6 months.  Since onset it is stable.  I, the attending physician,  performed the HPI with the patient and updated documentation appropriately. Additional comments: 6 Month CME f\u OS. OCT Pt states vision is about the same. Pt noticed green spots in vision yesterday. Pt has not seen them again since early this AM.Pt using gtts OS as directed.       Last edited by Tilda Franco on 05/05/2020  9:34 AM. (History)      Referring physician: Lavone Orn, MD [place] [place],  [place] [address]  HISTORICAL INFORMATION:   Selected notes from the MEDICAL RECORD NUMBER       CURRENT MEDICATIONS: Current Outpatient Medications (Ophthalmic Drugs)  Medication Sig  . atropine 1 % ophthalmic solution   . dorzolamide-timolol (COSOPT) 22.3-6.8 MG/ML ophthalmic solution   . DUREZOL 0.05 % EMUL INSTILL 1 DROP INTO LEFT EYE 4 TIMES A DAY  . erythromycin ophthalmic ointment SMARTSIG:In Eye(s)  . ketorolac (ACULAR) 0.4 % SOLN 1 drop  . ketorolac (ACULAR) 0.5 % ophthalmic solution INSTILL 1 DROP INTO LEFT EYE 4 TIMES A DAY  . prednisoLONE acetate (PRED FORTE) 1 % ophthalmic suspension INSTILL 1 DROP INTO LEFT EYE EVERY DAY   No current facility-administered medications for this visit. (Ophthalmic Drugs)   Current Outpatient Medications (Other)  Medication Sig  . amLODipine (NORVASC) 10 MG tablet Take 10 mg by mouth daily.  Marland Kitchen amLODipine (NORVASC) 5 MG tablet Take 5 mg by mouth daily.  Marland Kitchen atorvastatin (LIPITOR) 20 MG tablet Take 20  mg by mouth daily.  . benazepril-hydrochlorthiazide (LOTENSIN HCT) 10-12.5 MG tablet Take by mouth.  . cholecalciferol (VITAMIN D) 1000 UNITS tablet Take 1,000 Units by mouth daily.  . clindamycin (CLEOCIN) 150 MG capsule   . FLUAD QUADRIVALENT 0.5 ML injection   . GAVILYTE-N WITH FLAVOR PACK 420 g solution See admin instructions.  . metoCLOPramide (REGLAN) 10 MG tablet Take 20 mg by mouth daily.  . predniSONE (DELTASONE) 20 MG tablet TAKE 2 TABLETS BY MOUTH EVERY DAY FOR 7 DAYS  . tadalafil (CIALIS) 20 MG tablet SMARTSIG:1 Tablet(s) By Mouth Every 3 Days PRN  . vancomycin (VANCOCIN) 125 MG capsule Take 125 mg by mouth every 6 (six) hours.   No current facility-administered medications for this visit. (Other)      REVIEW OF SYSTEMS:    ALLERGIES Allergies  Allergen Reactions  . Penicillins Swelling and Rash    Swelling of tongue    PAST MEDICAL HISTORY Past Medical History:  Diagnosis Date  . Asthma    child ,none in adult years  . Blood dyscrasia    ? was told of something, but can't remember-no precautions given  . Cancer [facility]) 09-12-12   Prostate cancer '01-surgery only open  . Glaucoma    left eye, surgery done on right   Past Surgical History:  Procedure Laterality Date  . COLONOSCOPY WITH PROPOFOL N/A 09/30/2012  Procedure: COLONOSCOPY WITH PROPOFOL;  Surgeon: Garlan Fair, MD;  Location: [facility] ENDOSCOPY;  Service: Endoscopy;  Laterality: N/A;  . EYE SURGERY Right    for glaucoma  . JOINT REPLACEMENT Right rt. knee   RTKA age 74  . PROSTATE SURGERY    . TONSILLECTOMY      FAMILY HISTORY History reviewed. No pertinent family history.  SOCIAL HISTORY Social History   Tobacco Use  . Smoking status: Former Smoker    Packs/day: 0.50    Quit date: 09/12/1981    Years since quitting: 38.6  . Smokeless tobacco: Never Used  Substance Use Topics  . Alcohol use: Yes    Comment: social weekends  . Drug use: No    Types: Marijuana    Comment: past hx social  weekends-none in 30 yrs         OPHTHALMIC EXAM: Base Eye Exam    Visual Acuity (Snellen - Linear)      Right Left   Dist cc NLP 20/20       Tonometry (Tonopen, 9:38 AM)      Right Left   Pressure 44 6       Pupils      Dark Light Shape React   Right       Left 3 3 Round Minimal       Visual Fields      Left Right   Restrictions  Total superior temporal, inferior temporal, superior nasal, inferior nasal deficiencies       Neuro/Psych    Oriented x3: Yes   Mood/Affect: Normal       Dilation    Left eye: 1.0% Mydriacyl, 2.5% Phenylephrine @ 9:38 AM        Slit Lamp and Fundus Exam    External Exam      Right Left   External Normal Normal       Slit Lamp Exam      Right Left   Lids/Lashes Normal Normal   Conjunctiva/Sclera White and quiet White and quiet   Cornea Clear Clear   Anterior Chamber Deep and quiet Deep and quiet   Iris Round and reactive Round and reactive   Lens Posterior chamber intraocular lens Posterior chamber intraocular lens   Anterior Vitreous Normal Normal       Fundus Exam      Right Left   Posterior Vitreous no view Normal   Disc  Normal   C/D Ratio  0.75   Macula  Normal, no hemorrhage, no macular thickening   Vessels  Normal   Periphery  Normal          IMAGING AND PROCEDURES  Imaging and Procedures for 05/05/20  OCT, Retina - OS - Left Eye       Quality was good. Scan locations included subfoveal. Central Foveal Thickness: 266. Progression has been stable.   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  Now controlled after 4 times daily use of NSAIDs topically, stabilized and no recurrence using topical NSAIDs twice daily.  Patient has to use these twice daily henceforth in the left eye      ICD-10-CM   1. Cystoid macular edema of left eye  H35.352 OCT, Retina - OS - Left Eye    1.  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  Moreover history of recurrent CME in the left eye only control with topical NSAIDs.  Initially under control with 4 times daily usage now been stable with no recurrences for some years on twice daily usage topically  2.  May refill and continue topical ketorolac left eye 1 drop twice daily indefinitely.  3.  Ophthalmic Meds Ordered this visit:  No orders of the defined types were placed in this encounter.      Return in about 1 year (around 05/05/2021) for dilate, OS, OCT.  There are no Patient Instructions on file for this visit.   Explained the diagnoses, plan, and follow up with the patient and they expressed understanding.  Patient expressed understanding of the importance of proper follow up care.   Clent Demark Debora Stockdale M.D. Diseases & Surgery of the Retina and Vitreous [facility] 05/05/20     Abbreviations: M myopia (nearsighted); A astigmatism; H hyperopia (farsighted); P presbyopia; Mrx spectacle prescription;  CTL contact lenses; OD right eye; OS left eye; OU both eyes  XT exotropia; ET esotropia; PEK punctate epithelial keratitis; PEE punctate epithelial erosions; DES dry eye syndrome; MGD meibomian gland dysfunction; ATs artificial tears; PFAT's preservative free artificial tears; [place] nuclear sclerotic cataract; PSC posterior subcapsular cataract; ERM epi-retinal membrane; PVD posterior vitreous detachment; RD retinal detachment; DM diabetes mellitus; DR diabetic retinopathy; NPDR non-proliferative diabetic retinopathy; PDR proliferative diabetic retinopathy; CSME clinically significant macular edema; DME diabetic macular edema; dbh dot blot hemorrhages; CWS cotton wool spot; POAG primary open angle glaucoma; C/D cup-to-disc ratio; HVF humphrey visual field; GVF goldmann visual field; OCT optical coherence tomography; IOP intraocular pressure; BRVO Branch retinal vein occlusion; CRVO central retinal vein occlusion; CRAO  central retinal artery occlusion; BRAO branch retinal artery occlusion; RT retinal tear; SB scleral buckle; PPV pars plana vitrectomy; VH Vitreous hemorrhage; PRP panretinal laser photocoagulation; IVK intravitreal kenalog; VMT vitreomacular traction; MH Macular hole;  NVD neovascularization of the disc; NVE neovascularization elsewhere; AREDS age related eye disease study; ARMD age related macular degeneration; POAG primary open angle glaucoma; EBMD epithelial/anterior basement membrane dystrophy; ACIOL anterior chamber intraocular lens; IOL intraocular lens; PCIOL posterior chamber intraocular lens; Phaco/IOL phacoemulsification with intraocular lens placement; [place] photorefractive keratectomy; LASIK laser assisted in situ keratomileusis; HTN hypertension; DM diabetes mellitus; COPD chronic obstructive pulmonary disease

## 2020-05-05 NOTE — Assessment & Plan Note (Signed)
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  Now controlled after 4 times daily use of NSAIDs topically, stabilized and no recurrence using topical NSAIDs twice daily.  Patient has to use these twice daily henceforth in the left eye

## 2020-05-13 DIAGNOSIS — M79661 Pain in right lower leg: Secondary | ICD-10-CM | POA: Diagnosis not present

## 2020-05-13 DIAGNOSIS — M79641 Pain in right hand: Secondary | ICD-10-CM | POA: Diagnosis not present

## 2020-05-16 ENCOUNTER — Ambulatory Visit: Payer: PPO | Admitting: Podiatry

## 2020-05-16 ENCOUNTER — Other Ambulatory Visit: Payer: Self-pay

## 2020-05-16 ENCOUNTER — Encounter: Payer: Self-pay | Admitting: Podiatry

## 2020-05-16 DIAGNOSIS — E669 Obesity, unspecified: Secondary | ICD-10-CM | POA: Insufficient documentation

## 2020-05-16 DIAGNOSIS — F439 Reaction to severe stress, unspecified: Secondary | ICD-10-CM | POA: Insufficient documentation

## 2020-05-16 DIAGNOSIS — J4520 Mild intermittent asthma, uncomplicated: Secondary | ICD-10-CM | POA: Insufficient documentation

## 2020-05-16 DIAGNOSIS — Z860100 Personal history of colon polyps, unspecified: Secondary | ICD-10-CM | POA: Insufficient documentation

## 2020-05-16 DIAGNOSIS — D563 Thalassemia minor: Secondary | ICD-10-CM | POA: Insufficient documentation

## 2020-05-16 DIAGNOSIS — M79672 Pain in left foot: Secondary | ICD-10-CM

## 2020-05-16 DIAGNOSIS — K5730 Diverticulosis of large intestine without perforation or abscess without bleeding: Secondary | ICD-10-CM | POA: Insufficient documentation

## 2020-05-16 DIAGNOSIS — Q828 Other specified congenital malformations of skin: Secondary | ICD-10-CM

## 2020-05-16 DIAGNOSIS — G5600 Carpal tunnel syndrome, unspecified upper limb: Secondary | ICD-10-CM | POA: Insufficient documentation

## 2020-05-16 DIAGNOSIS — Z8546 Personal history of malignant neoplasm of prostate: Secondary | ICD-10-CM | POA: Insufficient documentation

## 2020-05-16 DIAGNOSIS — M79676 Pain in unspecified toe(s): Secondary | ICD-10-CM

## 2020-05-16 DIAGNOSIS — E7800 Pure hypercholesterolemia, unspecified: Secondary | ICD-10-CM | POA: Insufficient documentation

## 2020-05-16 DIAGNOSIS — I739 Peripheral vascular disease, unspecified: Secondary | ICD-10-CM | POA: Insufficient documentation

## 2020-05-16 DIAGNOSIS — B351 Tinea unguium: Secondary | ICD-10-CM | POA: Diagnosis not present

## 2020-05-16 DIAGNOSIS — N529 Male erectile dysfunction, unspecified: Secondary | ICD-10-CM | POA: Insufficient documentation

## 2020-05-16 DIAGNOSIS — D126 Benign neoplasm of colon, unspecified: Secondary | ICD-10-CM | POA: Insufficient documentation

## 2020-05-16 DIAGNOSIS — K279 Peptic ulcer, site unspecified, unspecified as acute or chronic, without hemorrhage or perforation: Secondary | ICD-10-CM | POA: Insufficient documentation

## 2020-05-16 DIAGNOSIS — M79671 Pain in right foot: Secondary | ICD-10-CM

## 2020-05-16 DIAGNOSIS — I10 Essential (primary) hypertension: Secondary | ICD-10-CM | POA: Insufficient documentation

## 2020-05-16 HISTORY — DX: Essential (primary) hypertension: I10

## 2020-05-18 DIAGNOSIS — G5601 Carpal tunnel syndrome, right upper limb: Secondary | ICD-10-CM | POA: Diagnosis not present

## 2020-05-21 NOTE — Progress Notes (Signed)
  Subjective:  Patient ID: Joel Howell, male    DOB: 28-Aug-1940,  MRN: [id number]  [name] presents to clinic today for painful thick toenails that are difficult to trim. Pain interferes with ambulation. Aggravating factors include wearing enclosed shoe gear. Pain is relieved with periodic professional debridement.  80 y.o. male presents with the above complaint.    Review of Systems: Negative except as noted in the HPI.  Allergies  Allergen Reactions  . Penicillins Swelling and Rash    Swelling of tongue  . Penicillin G     Other reaction(s): tongue swells  . Shellfish-Derived Products     Other reaction(s): tongue swelling     Objective:   Constitutional Joel Howell is a pleasant 80 y.o. African American male, WD, WN in NAD. AAO x 3.   Vascular Capillary refill time to digits immediate b/l. Palpable pedal pulses b/l LE. Pedal hair present. Lower extremity skin temperature gradient within normal limits. No cyanosis or clubbing noted.  Neurologic Normal speech. Oriented to person, place, and time. Protective sensation intact 5/5 intact bilaterally with 10g monofilament b/l. Vibratory sensation intact b/l.  Dermatologic Pedal skin is thin shiny, atrophic b/l lower extremities. No open wounds bilaterally. No interdigital macerations bilaterally. Toenails L hallux, L 2nd toe, L 3rd toe, L 4th toe, R hallux, R 2nd toe, R 3rd toe and R 4th toe elongated, discolored, dystrophic, thickened, and crumbly with subungual debris and tenderness to dorsal palpation. Anonychia noted L 5th toe and R 5th toe. Nailbed(s) epithelialized.  Porokeratotic lesion(s) R hallux and submet head 2 left foot. No erythema, no edema, no drainage, no flocculence.  Orthopedic: Normal muscle strength 5/5 to all lower extremity muscle groups bilaterally. No pain crepitus or joint limitation noted with ROM b/l. 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   Radiographs: None Assessment:    1. Pain due to onychomycosis of toenail   2. Porokeratosis   3. Bilateral foot pain    Plan:  Patient was evaluated and treated and all questions answered.  Onychomycosis with pain -Nails palliatively debridement as below -Educated on self-care  Procedure: Nail Debridement Rationale: Pain Type of Debridement: manual, sharp debridement. Instrumentation: Nail nipper, rotary burr. Number of Nails: 8  -Examined patient. -No new findings. No new orders. -Patient to continue soft, supportive shoe gear daily. -Toenails L hallux, L 2nd toe, L 3rd toe, L 4th toe, R hallux, R 2nd toe, R 3rd toe and R 4th toe debrided in length and girth without iatrogenic bleeding with sterile nail nipper and dremel.  -Painful porokeratotic lesion(s) R hallux and submet head 2 left foot pared and enucleated with sterile scalpel blade without incident. -Patient to report any pedal injuries to medical professional immediately. -Patient/POA to call should there be question/concern in the interim.  Return in about 3 months (around 08/13/2020).  Marzetta Board, DPM

## 2020-05-23 DIAGNOSIS — I10 Essential (primary) hypertension: Secondary | ICD-10-CM | POA: Diagnosis not present

## 2020-05-27 DIAGNOSIS — G5601 Carpal tunnel syndrome, right upper limb: Secondary | ICD-10-CM | POA: Diagnosis not present

## 2020-06-23 DIAGNOSIS — D563 Thalassemia minor: Secondary | ICD-10-CM | POA: Diagnosis not present

## 2020-06-23 DIAGNOSIS — E7800 Pure hypercholesterolemia, unspecified: Secondary | ICD-10-CM | POA: Diagnosis not present

## 2020-06-23 DIAGNOSIS — I10 Essential (primary) hypertension: Secondary | ICD-10-CM | POA: Diagnosis not present

## 2020-06-23 DIAGNOSIS — H409 Unspecified glaucoma: Secondary | ICD-10-CM | POA: Diagnosis not present

## 2020-06-23 DIAGNOSIS — J4520 Mild intermittent asthma, uncomplicated: Secondary | ICD-10-CM | POA: Diagnosis not present

## 2020-06-23 DIAGNOSIS — Z8546 Personal history of malignant neoplasm of prostate: Secondary | ICD-10-CM | POA: Diagnosis not present

## 2020-06-30 DIAGNOSIS — G5601 Carpal tunnel syndrome, right upper limb: Secondary | ICD-10-CM | POA: Diagnosis not present

## 2020-07-07 DIAGNOSIS — E7800 Pure hypercholesterolemia, unspecified: Secondary | ICD-10-CM | POA: Diagnosis not present

## 2020-07-07 DIAGNOSIS — D563 Thalassemia minor: Secondary | ICD-10-CM | POA: Diagnosis not present

## 2020-07-07 DIAGNOSIS — H409 Unspecified glaucoma: Secondary | ICD-10-CM | POA: Diagnosis not present

## 2020-07-07 DIAGNOSIS — J4520 Mild intermittent asthma, uncomplicated: Secondary | ICD-10-CM | POA: Diagnosis not present

## 2020-07-07 DIAGNOSIS — I10 Essential (primary) hypertension: Secondary | ICD-10-CM | POA: Diagnosis not present

## 2020-07-12 DIAGNOSIS — M25631 Stiffness of right wrist, not elsewhere classified: Secondary | ICD-10-CM | POA: Diagnosis not present

## 2020-07-22 DIAGNOSIS — I10 Essential (primary) hypertension: Secondary | ICD-10-CM | POA: Diagnosis not present

## 2020-08-02 DIAGNOSIS — I10 Essential (primary) hypertension: Secondary | ICD-10-CM | POA: Diagnosis not present

## 2020-08-02 DIAGNOSIS — J4520 Mild intermittent asthma, uncomplicated: Secondary | ICD-10-CM | POA: Diagnosis not present

## 2020-08-16 DIAGNOSIS — Z961 Presence of intraocular lens: Secondary | ICD-10-CM | POA: Diagnosis not present

## 2020-08-16 DIAGNOSIS — H2511 Age-related nuclear cataract, right eye: Secondary | ICD-10-CM | POA: Diagnosis not present

## 2020-08-16 DIAGNOSIS — H0102B Squamous blepharitis left eye, upper and lower eyelids: Secondary | ICD-10-CM | POA: Diagnosis not present

## 2020-08-16 DIAGNOSIS — H0102A Squamous blepharitis right eye, upper and lower eyelids: Secondary | ICD-10-CM | POA: Diagnosis not present

## 2020-08-16 DIAGNOSIS — H35352 Cystoid macular degeneration, left eye: Secondary | ICD-10-CM | POA: Diagnosis not present

## 2020-08-16 DIAGNOSIS — H401133 Primary open-angle glaucoma, bilateral, severe stage: Secondary | ICD-10-CM | POA: Diagnosis not present

## 2020-08-23 DIAGNOSIS — J4520 Mild intermittent asthma, uncomplicated: Secondary | ICD-10-CM | POA: Diagnosis not present

## 2020-08-23 DIAGNOSIS — E7800 Pure hypercholesterolemia, unspecified: Secondary | ICD-10-CM | POA: Diagnosis not present

## 2020-08-23 DIAGNOSIS — I10 Essential (primary) hypertension: Secondary | ICD-10-CM | POA: Diagnosis not present

## 2020-08-23 DIAGNOSIS — H409 Unspecified glaucoma: Secondary | ICD-10-CM | POA: Diagnosis not present

## 2020-08-23 DIAGNOSIS — D563 Thalassemia minor: Secondary | ICD-10-CM | POA: Diagnosis not present

## 2020-08-23 DIAGNOSIS — Z8546 Personal history of malignant neoplasm of prostate: Secondary | ICD-10-CM | POA: Diagnosis not present

## 2020-08-26 ENCOUNTER — Other Ambulatory Visit: Payer: Self-pay

## 2020-08-26 ENCOUNTER — Encounter: Payer: Self-pay | Admitting: Podiatry

## 2020-08-26 ENCOUNTER — Ambulatory Visit: Payer: PPO | Admitting: Podiatry

## 2020-08-26 DIAGNOSIS — Q828 Other specified congenital malformations of skin: Secondary | ICD-10-CM

## 2020-08-26 DIAGNOSIS — M79671 Pain in right foot: Secondary | ICD-10-CM

## 2020-08-26 DIAGNOSIS — M79676 Pain in unspecified toe(s): Secondary | ICD-10-CM | POA: Diagnosis not present

## 2020-08-26 DIAGNOSIS — M79672 Pain in left foot: Secondary | ICD-10-CM

## 2020-08-26 DIAGNOSIS — B351 Tinea unguium: Secondary | ICD-10-CM

## 2020-08-26 NOTE — Progress Notes (Signed)
Subjective: Joel Howell is a pleasant 80 y.o. male patient seen today for porokeratoses b/l feet and painful thick toenails that are difficult to trim. Pain interferes with ambulation. Aggravating factors include wearing enclosed shoe gear. Pain is relieved with periodic professional debridement.  He notes no new pedal problems on today's visit. He will be traveling to [place] with a friend soon.  PCP is Lavone Orn, MD. Last visit was: 05/23/2020.  Allergies  Allergen Reactions  . Penicillins Swelling and Rash    Swelling of tongue  . Penicillin G     Other reaction(s): tongue swells  . Shellfish-Derived Products     Other reaction(s): tongue swelling    Objective: Physical Exam  General: Joel Howell is a pleasant 80 y.o. African American male, WD, WN in NAD. AAO x 3.   Vascular:  Capillary refill time to digits immediate b/l. Palpable DP pulse(s) b/l lower extremities Palpable PT pulse(s) b/l lower extremities Pedal hair sparse. Lower extremity skin temperature gradient within normal limits. No pain with calf compression b/l. No edema noted b/l lower extremities.  Dermatological:  Pedal skin with normal turgor, texture and tone bilaterally. No open wounds bilaterally. No interdigital macerations bilaterally. Toenails 1-5 b/l elongated, discolored, dystrophic, thickened, crumbly with subungual debris and tenderness to dorsal palpation. Porokeratotic lesion(s) R hallux and submet head 2 left foot. No erythema, no edema, no drainage, no fluctuance.  Musculoskeletal:  Normal muscle strength 5/5 to all lower extremity muscle groups bilaterally. No pain crepitus or joint limitation noted with ROM b/l. 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  Neurological:  Protective sensation intact 5/5 intact bilaterally with 10g monofilament b/l. Vibratory sensation intact b/l.  Assessment and Plan:  1. Pain due to onychomycosis of toenail   2. Porokeratosis    3. Bilateral foot pain     -Examined patient. -Patient to continue soft, supportive shoe gear daily. -Toenails 1-5 b/l were debrided in length and girth with sterile nail nippers and dremel without iatrogenic bleeding.  -Painful porokeratotic lesion(s) R hallux and submet head 2 left foot pared and enucleated with sterile scalpel blade without incident. Total number of lesions debrided=2. -Patient to report any pedal injuries to medical professional immediately. -Patient/POA to call should there be question/concern in the interim.  Return in about 3 months (around 11/26/2020).  Marzetta Board, DPM

## 2020-10-13 DIAGNOSIS — Z8546 Personal history of malignant neoplasm of prostate: Secondary | ICD-10-CM | POA: Diagnosis not present

## 2020-10-13 DIAGNOSIS — J4520 Mild intermittent asthma, uncomplicated: Secondary | ICD-10-CM | POA: Diagnosis not present

## 2020-10-13 DIAGNOSIS — D563 Thalassemia minor: Secondary | ICD-10-CM | POA: Diagnosis not present

## 2020-10-13 DIAGNOSIS — E7800 Pure hypercholesterolemia, unspecified: Secondary | ICD-10-CM | POA: Diagnosis not present

## 2020-10-13 DIAGNOSIS — I10 Essential (primary) hypertension: Secondary | ICD-10-CM | POA: Diagnosis not present

## 2020-10-13 DIAGNOSIS — H409 Unspecified glaucoma: Secondary | ICD-10-CM | POA: Diagnosis not present

## 2020-10-21 DIAGNOSIS — I10 Essential (primary) hypertension: Secondary | ICD-10-CM | POA: Diagnosis not present

## 2020-10-26 DIAGNOSIS — M25551 Pain in right hip: Secondary | ICD-10-CM | POA: Diagnosis not present

## 2020-10-26 DIAGNOSIS — M545 Low back pain, unspecified: Secondary | ICD-10-CM | POA: Diagnosis not present

## 2020-10-26 DIAGNOSIS — Z96651 Presence of right artificial knee joint: Secondary | ICD-10-CM | POA: Diagnosis not present

## 2020-10-27 ENCOUNTER — Other Ambulatory Visit: Payer: Self-pay | Admitting: Internal Medicine

## 2020-10-27 DIAGNOSIS — R9389 Abnormal findings on diagnostic imaging of other specified body structures: Secondary | ICD-10-CM

## 2020-11-04 ENCOUNTER — Ambulatory Visit
Admission: RE | Admit: 2020-11-04 | Discharge: 2020-11-04 | Disposition: A | Discharge status: Home / Self Care | Payer: PPO | Source: Ambulatory Visit | Attending: Internal Medicine | Admitting: Internal Medicine

## 2020-11-04 DIAGNOSIS — I700 Atherosclerosis of aorta: Secondary | ICD-10-CM | POA: Diagnosis not present

## 2020-11-04 DIAGNOSIS — R9389 Abnormal findings on diagnostic imaging of other specified body structures: Secondary | ICD-10-CM

## 2020-11-04 DIAGNOSIS — M545 Low back pain, unspecified: Secondary | ICD-10-CM | POA: Insufficient documentation

## 2020-11-10 DIAGNOSIS — Z96651 Presence of right artificial knee joint: Secondary | ICD-10-CM | POA: Diagnosis not present

## 2020-11-15 ENCOUNTER — Other Ambulatory Visit: Payer: PPO

## 2020-11-18 DIAGNOSIS — M545 Low back pain, unspecified: Secondary | ICD-10-CM | POA: Diagnosis not present

## 2020-12-02 DIAGNOSIS — D563 Thalassemia minor: Secondary | ICD-10-CM | POA: Diagnosis not present

## 2020-12-02 DIAGNOSIS — E7800 Pure hypercholesterolemia, unspecified: Secondary | ICD-10-CM | POA: Diagnosis not present

## 2020-12-02 DIAGNOSIS — J4520 Mild intermittent asthma, uncomplicated: Secondary | ICD-10-CM | POA: Diagnosis not present

## 2020-12-02 DIAGNOSIS — I10 Essential (primary) hypertension: Secondary | ICD-10-CM | POA: Diagnosis not present

## 2020-12-02 DIAGNOSIS — H409 Unspecified glaucoma: Secondary | ICD-10-CM | POA: Diagnosis not present

## 2020-12-05 ENCOUNTER — Encounter: Payer: Self-pay | Admitting: Podiatry

## 2020-12-05 ENCOUNTER — Other Ambulatory Visit: Payer: Self-pay

## 2020-12-05 ENCOUNTER — Ambulatory Visit: Payer: PPO | Admitting: Podiatry

## 2020-12-05 DIAGNOSIS — M79671 Pain in right foot: Secondary | ICD-10-CM

## 2020-12-05 DIAGNOSIS — M79672 Pain in left foot: Secondary | ICD-10-CM

## 2020-12-05 DIAGNOSIS — M79676 Pain in unspecified toe(s): Secondary | ICD-10-CM | POA: Diagnosis not present

## 2020-12-05 DIAGNOSIS — Q828 Other specified congenital malformations of skin: Secondary | ICD-10-CM | POA: Diagnosis not present

## 2020-12-05 DIAGNOSIS — B351 Tinea unguium: Secondary | ICD-10-CM

## 2020-12-08 NOTE — Progress Notes (Signed)
Subjective: Joel Howell is a 80 y.o. male patient seen today for follow up of painful porokeratoses of both feet and painful thick toenails that are difficult to trim. Pain interferes with ambulation. Aggravating factors include wearing enclosed shoe gear. Pain is relieved with periodic professional debridement.  He states he still has some swelling of right lower extremity.  PCP is Lavone Orn, MD. Last visit was: 08/23/2020.  New problems reported today: None.  Allergies  Allergen Reactions   Penicillins Swelling and Rash    Swelling of tongue   Penicillin G     Other reaction(s): tongue swells   Shellfish-Derived Products     Other reaction(s): tongue swelling    PCP is Lavone Orn, MD .  Objective: Physical Exam  General: Patient is a pleasant 80 y.o. African American male WD, WN in NAD. AAO x 3.   Neurovascular Examination: Capillary refill time to digits immediate b/l. Palpable pedal pulses b/l LE. Pedal hair sparse b/l. Lower extremity skin temperature gradient within normal limits. Trace edema noted RLE.  Protective sensation intact 5/5 intact bilaterally with 10g monofilament b/l. Vibratory sensation intact b/l.  Dermatological:  Skin warm and supple b/l lower extremities. No open wounds b/l lower extremities. No interdigital macerations b/l lower extremities. Toenails 1-5 b/l elongated, discolored, dystrophic, thickened, crumbly with subungual debris and tenderness to dorsal palpation. Porokeratotic lesion submet head 2 left foot with tenderness to palpation. No erythema, no edema, no drainage, no fluctuance. Porokeratosis right hallux has resolved.  Musculoskeletal:  Normal muscle strength 5/5 to all lower extremity muscle groups bilaterally. No pain crepitus or joint limitation noted with ROM b/l lower extremities. Gross deformities: Hallux valgus with bunion deformity noted b/l lower extremities.  Assessment: 1. Pain due to onychomycosis of toenail   2.  Porokeratosis   3. Bilateral foot pain   Plan: -Examined patient. -Patient to continue soft, supportive shoe gear daily. -Toenails 1-5 b/l were debrided in length and girth with sterile nail nippers and dremel without iatrogenic bleeding.  -Painful porokeratotic lesion(s) submet head 2 right foot pared and enucleated with sterile scalpel blade without incident. Total number of lesions debrided=1. -Patient to report any pedal injuries to medical professional immediately. -Patient/POA to call should there be question/concern in the interim.  Return in about 3 months (around 03/06/2021).  Marzetta Board, DPM

## 2020-12-14 DIAGNOSIS — H43812 Vitreous degeneration, left eye: Secondary | ICD-10-CM | POA: Diagnosis not present

## 2020-12-14 DIAGNOSIS — H401133 Primary open-angle glaucoma, bilateral, severe stage: Secondary | ICD-10-CM | POA: Diagnosis not present

## 2020-12-14 DIAGNOSIS — H0102B Squamous blepharitis left eye, upper and lower eyelids: Secondary | ICD-10-CM | POA: Diagnosis not present

## 2020-12-14 DIAGNOSIS — H2511 Age-related nuclear cataract, right eye: Secondary | ICD-10-CM | POA: Diagnosis not present

## 2020-12-14 DIAGNOSIS — Z961 Presence of intraocular lens: Secondary | ICD-10-CM | POA: Diagnosis not present

## 2020-12-14 DIAGNOSIS — H0102A Squamous blepharitis right eye, upper and lower eyelids: Secondary | ICD-10-CM | POA: Diagnosis not present

## 2020-12-14 DIAGNOSIS — H35352 Cystoid macular degeneration, left eye: Secondary | ICD-10-CM | POA: Diagnosis not present

## 2021-01-23 DIAGNOSIS — I10 Essential (primary) hypertension: Secondary | ICD-10-CM | POA: Diagnosis not present

## 2021-02-02 DIAGNOSIS — Z23 Encounter for immunization: Secondary | ICD-10-CM | POA: Diagnosis not present

## 2021-02-02 DIAGNOSIS — Z8546 Personal history of malignant neoplasm of prostate: Secondary | ICD-10-CM | POA: Diagnosis not present

## 2021-02-02 DIAGNOSIS — I10 Essential (primary) hypertension: Secondary | ICD-10-CM | POA: Diagnosis not present

## 2021-02-02 DIAGNOSIS — I739 Peripheral vascular disease, unspecified: Secondary | ICD-10-CM | POA: Diagnosis not present

## 2021-02-02 DIAGNOSIS — E7800 Pure hypercholesterolemia, unspecified: Secondary | ICD-10-CM | POA: Diagnosis not present

## 2021-02-02 DIAGNOSIS — M5416 Radiculopathy, lumbar region: Secondary | ICD-10-CM | POA: Diagnosis not present

## 2021-02-02 DIAGNOSIS — Z86010 Personal history of colonic polyps: Secondary | ICD-10-CM | POA: Diagnosis not present

## 2021-02-02 DIAGNOSIS — Z0000 Encounter for general adult medical examination without abnormal findings: Secondary | ICD-10-CM | POA: Diagnosis not present

## 2021-02-02 DIAGNOSIS — Z1389 Encounter for screening for other disorder: Secondary | ICD-10-CM | POA: Diagnosis not present

## 2021-02-22 DIAGNOSIS — I10 Essential (primary) hypertension: Secondary | ICD-10-CM | POA: Diagnosis not present

## 2021-03-07 ENCOUNTER — Encounter: Payer: Self-pay | Admitting: Podiatry

## 2021-03-07 ENCOUNTER — Other Ambulatory Visit: Payer: Self-pay

## 2021-03-07 ENCOUNTER — Ambulatory Visit: Payer: PPO | Admitting: Podiatry

## 2021-03-07 DIAGNOSIS — R9389 Abnormal findings on diagnostic imaging of other specified body structures: Secondary | ICD-10-CM | POA: Insufficient documentation

## 2021-03-07 DIAGNOSIS — M79676 Pain in unspecified toe(s): Secondary | ICD-10-CM

## 2021-03-07 DIAGNOSIS — B351 Tinea unguium: Secondary | ICD-10-CM

## 2021-03-07 NOTE — Progress Notes (Signed)
  Subjective:  Patient ID: Joel Howell, male    DOB: [birth year],  MRN: [id number]  Joel Howell presents to clinic today for painful elongated mycotic toenails 1-5 bilaterally which are tender when wearing enclosed shoe gear. Pain is relieved with periodic professional debridement.  Patient relates no new pedal concerns on today's visit.  PCP is Lavone Orn, MD , and last visit was 02/22/2021.  Allergies  Allergen Reactions   Penicillins Swelling and Rash    Swelling of tongue   Penicillin G     Other reaction(s): tongue swells Other reaction(s): tongue swells   Shellfish-Derived Products     Other reaction(s): tongue swelling Other reaction(s): tongue swelling    Review of Systems: Negative except as noted in the HPI. Objective:   Constitutional Joel Howell is a pleasant 80 y.o. African American male, WD, WN in NAD. AAO x 3.   Vascular CFT immediate b/l LE. Palpable DP/PT pulses b/l LE. Digital hair absent b/l. Skin temperature gradient WNL b/l. No pain with calf compression b/l. No edema noted b/l. No cyanosis or clubbing noted b/l LE.  Neurologic Normal speech. Oriented to person, place, and time. Protective sensation intact 5/5 intact bilaterally with 10g monofilament b/l. Vibratory sensation intact b/l.  Dermatologic Pedal skin thin, shiny and atrophic b/l LE. No open wounds b/l LE. No interdigital macerations noted b/l LE. Toenails 1-5 b/l elongated, discolored, dystrophic, thickened, crumbly with subungual debris and tenderness to dorsal palpation. No hyperkeratotic nor porokeratotic lesions present on today's visit.  Orthopedic: Normal muscle strength 5/5 to all lower extremity muscle groups bilaterally. Pes planus deformity noted bilateral LE.Marland Kitchen No pain, crepitus or joint limitation noted with ROM b/l LE.  Patient ambulates independently without assistive aids.   Radiographs: None   Assessment:   1. Pain due to onychomycosis of toenail    Plan:  Patient was  evaluated and treated and all questions answered. Consent given for treatment as described below: -No new findings. No new orders. -Mycotic toenails 1-5 bilaterally were debrided in length and girth with sterile nail nippers and dremel without incident. -Patient/POA to call should there be question/concern in the interim.  Return in about 3 months (around 06/05/2021).  Marzetta Board, DPM

## 2021-03-24 DIAGNOSIS — I10 Essential (primary) hypertension: Secondary | ICD-10-CM | POA: Diagnosis not present

## 2021-04-17 DIAGNOSIS — Z961 Presence of intraocular lens: Secondary | ICD-10-CM | POA: Diagnosis not present

## 2021-04-17 DIAGNOSIS — H401133 Primary open-angle glaucoma, bilateral, severe stage: Secondary | ICD-10-CM | POA: Diagnosis not present

## 2021-04-17 DIAGNOSIS — H0102A Squamous blepharitis right eye, upper and lower eyelids: Secondary | ICD-10-CM | POA: Diagnosis not present

## 2021-04-17 DIAGNOSIS — H0102B Squamous blepharitis left eye, upper and lower eyelids: Secondary | ICD-10-CM | POA: Diagnosis not present

## 2021-04-17 DIAGNOSIS — H35352 Cystoid macular degeneration, left eye: Secondary | ICD-10-CM | POA: Diagnosis not present

## 2021-04-17 DIAGNOSIS — H43812 Vitreous degeneration, left eye: Secondary | ICD-10-CM | POA: Diagnosis not present

## 2021-04-17 DIAGNOSIS — H2511 Age-related nuclear cataract, right eye: Secondary | ICD-10-CM | POA: Diagnosis not present

## 2021-04-19 ENCOUNTER — Other Ambulatory Visit (INDEPENDENT_AMBULATORY_CARE_PROVIDER_SITE_OTHER): Payer: Self-pay | Admitting: Ophthalmology

## 2021-05-11 ENCOUNTER — Encounter (INDEPENDENT_AMBULATORY_CARE_PROVIDER_SITE_OTHER): Payer: PPO | Admitting: Ophthalmology

## 2021-05-11 ENCOUNTER — Encounter (INDEPENDENT_AMBULATORY_CARE_PROVIDER_SITE_OTHER): Payer: Self-pay | Admitting: Ophthalmology

## 2021-05-11 ENCOUNTER — Ambulatory Visit (INDEPENDENT_AMBULATORY_CARE_PROVIDER_SITE_OTHER): Payer: PPO | Admitting: Ophthalmology

## 2021-05-11 ENCOUNTER — Other Ambulatory Visit: Payer: Self-pay

## 2021-05-11 DIAGNOSIS — H401123 Primary open-angle glaucoma, left eye, severe stage: Secondary | ICD-10-CM

## 2021-05-11 DIAGNOSIS — H35352 Cystoid macular degeneration, left eye: Secondary | ICD-10-CM | POA: Diagnosis not present

## 2021-05-11 DIAGNOSIS — H5440 Blindness, one eye, unspecified eye: Secondary | ICD-10-CM | POA: Insufficient documentation

## 2021-05-11 DIAGNOSIS — H179 Unspecified corneal scar and opacity: Secondary | ICD-10-CM | POA: Insufficient documentation

## 2021-05-11 NOTE — Assessment & Plan Note (Signed)
No pain or discomfort as of this date, not using atropine in the right eye according to patient.

## 2021-05-11 NOTE — Assessment & Plan Note (Signed)
Under the direction of Dr. Warden Fillers  Patient continues on Trusopt, timolol,

## 2021-05-11 NOTE — Assessment & Plan Note (Signed)
Due to absolute glaucoma and history of uveitis

## 2021-05-11 NOTE — Progress Notes (Signed)
05/11/2021     CHIEF COMPLAINT Patient presents for  Chief Complaint  Patient presents with   Cystoid Macular Edema      HISTORY OF PRESENT ILLNESS: Joel Howell is a 81 y.o. male who presents to the clinic today for:   HPI   1 yr fu OS OCT. Patient states vision is stable and unchanged since last visit. Denies any new floaters or FOL. "I have a grey top I use twice a day, a blue twice a day, and a pink top I use once a day all only in the left eye." Last edited by Laurin Coder on 05/11/2021 10:30 AM.      Referring physician: Warden Fillers, MD [address] [place],   [id number]  HISTORICAL INFORMATION:   Selected notes from the MEDICAL RECORD NUMBER       CURRENT MEDICATIONS: Current Outpatient Medications (Ophthalmic Drugs)  Medication Sig   dorzolamide (TRUSOPT) 2 % ophthalmic solution 1 drop into affected eye   dorzolamide-timolol (COSOPT) 22.3-6.8 MG/ML ophthalmic solution    DUREZOL 0.05 % EMUL Place 1 drop into the left eye daily.   erythromycin ophthalmic ointment SMARTSIG:In Eye(s)   ketorolac (ACULAR) 0.4 % SOLN 1 drop   ketorolac (ACULAR) 0.5 % ophthalmic solution INSTILL 1 DROP INTO LEFT EYE 4 TIMES A DAY   prednisoLONE acetate (PRED FORTE) 1 % ophthalmic suspension INSTILL 1 DROP INTO LEFT EYE EVERY DAY   No current facility-administered medications for this visit. (Ophthalmic Drugs)   Current Outpatient Medications (Other)  Medication Sig   amLODipine (NORVASC) 10 MG tablet Take 10 mg by mouth daily.   amLODipine (NORVASC) 10 MG tablet Take 1 tablet by mouth daily.   amLODipine (NORVASC) 5 MG tablet Take 5 mg by mouth daily.   atorvastatin (LIPITOR) 20 MG tablet Take 20 mg by mouth daily.   benazepril-hydrochlorthiazide (LOTENSIN HCT) 10-12.5 MG tablet Take by mouth.   cholecalciferol (VITAMIN D) 1000 UNITS tablet Take 1,000 Units by mouth daily.   clindamycin (CLEOCIN) 150 MG capsule    FLUAD QUADRIVALENT 0.5 ML  injection    GAVILYTE-N WITH FLAVOR PACK 420 g solution See admin instructions.   HYDROcodone-acetaminophen (NORCO/VICODIN) 5-325 MG tablet hydrocodone 5 mg-acetaminophen 325 mg tablet  TAKE 1-2 TABLETS EVERY 4-6 HOURS AS NEEDED FOR PAIN FOR 5 DAYS. NOT TO EXCEED 10 TABLETS A DAY   methocarbamol (ROBAXIN) 500 MG tablet methocarbamol 500 mg tablet  TAKE ONE TABLET BY MOUTH EVERY EIGHT HOURS AS NEEDED FOR MUSCLE SPASMS.   metoCLOPramide (REGLAN) 10 MG tablet Take 20 mg by mouth daily.   predniSONE (DELTASONE) 5 MG tablet prednisone 5 mg tablet  TAKE 1 TAB 3 TIMES DAILY FOR 2 DAYS, 1 TAB TWICE DAILY FOR 5 DAYS, THEN 1 TAB DAILY UNTIL GONE   tadalafil (CIALIS) 20 MG tablet SMARTSIG:1 Tablet(s) By Mouth Every 3 Days PRN   vancomycin (VANCOCIN) 125 MG capsule Take 125 mg by mouth every 6 (six) hours.   No current facility-administered medications for this visit. (Other)      REVIEW OF SYSTEMS:    ALLERGIES Allergies  Allergen Reactions   Penicillins Swelling and Rash    Swelling of tongue   Penicillin G     Other reaction(s): tongue swells Other reaction(s): tongue swells   Shellfish-Derived Products     Other reaction(s): tongue swelling Other reaction(s): tongue swelling    PAST MEDICAL HISTORY Past Medical History:  Diagnosis Date   Asthma  child ,none in adult years   Blood dyscrasia    ? was told of something, but can't remember-no precautions given   Cancer [facility]) 09-12-12   Prostate cancer '01-surgery only open   Essential hypertension 05/16/2020   Glaucoma    left eye, surgery done on right   Past Surgical History:  Procedure Laterality Date   COLONOSCOPY WITH PROPOFOL N/A 09/30/2012   Procedure: COLONOSCOPY WITH PROPOFOL;  Surgeon: Garlan Fair, MD;  Location: [facility];  Service: Endoscopy;  Laterality: N/A;   EYE SURGERY Right    for glaucoma   JOINT REPLACEMENT Right rt. knee   RTKA age 17   PROSTATE SURGERY     TONSILLECTOMY      FAMILY  HISTORY History reviewed. No pertinent family history.  SOCIAL HISTORY Social History   Tobacco Use   Smoking status: Former    Packs/day: 0.50    Types: Cigarettes    Quit date: 09/12/1981    Years since quitting: 39.6   Smokeless tobacco: Never  Substance Use Topics   Alcohol use: Yes    Comment: social weekends   Drug use: No    Types: Marijuana    Comment: past hx social weekends-none in 30 yrs         OPHTHALMIC EXAM:  Base Eye Exam     Visual Acuity (ETDRS)       Right Left   Dist cc NLP 20/20    Correction: Glasses         Tonometry (Tonopen, 10:33 AM)       Right Left   Pressure 7 6         Pupils       Dark Light Shape React APD   Right No view  Irregular     Left 3 2 Round Minimal None         Extraocular Movement       Right Left    Full Full         Neuro/Psych     Oriented x3: Yes   Mood/Affect: Normal         Dilation     Left eye: 1.0% Mydriacyl, 2.5% Phenylephrine @ 10:31 AM           Slit Lamp and Fundus Exam     External Exam       Right Left   External Normal Normal         Slit Lamp Exam       Right Left   Lids/Lashes Normal Normal   Conjunctiva/Sclera White and quiet White and quiet   Cornea Clear Clear   Anterior Chamber Deep and quiet Deep and quiet   Iris Round and reactive Round and reactive   Lens Posterior chamber intraocular lens Posterior chamber intraocular lens   Anterior Vitreous Normal Normal         Fundus Exam       Right Left   Posterior Vitreous no view Normal, no cells, no cells   Disc  Normal   C/D Ratio  0.75   Macula  Normal, no hemorrhage, no macular thickening   Vessels  Normal   Periphery  Normal           Refraction     Wearing Rx     Age: 52-30yrs            IMAGING AND PROCEDURES  Imaging and Procedures for 05/11/21  OCT, Retina - OU - Both Eyes  Left Eye Quality was good. Scan locations included subfoveal. Central Foveal Thickness:  261. Progression has no prior data. Findings include normal observations.              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 Warden Fillers  Patient continues on Trusopt, timolol,   Cystoid macular edema of left eye OS, patient continues on Acular twice daily with excellent effect preventing recurrence of CME which was strikingly active in 2019.  My opinion the patient does not need atropine use in the left eye.  If okay with Dr. Katy Fitch okay to discontinue atropine use in the left eye he says he is using  Blind right eye No pain or discomfort as of this date, not using atropine in the right eye according to patient.     ICD-10-CM   1. Cystoid macular edema of left eye  H35.352 OCT, Retina - OU - Both Eyes    2. Right corneal scar with opacity  H17.9     3. Primary open angle glaucoma of left eye, severe stage  H40.1123       1.  OS, CME remains quiet and stabilized on twice daily ketorolac.  2.  OAG OS as per Dr. Warden Fillers.  Patient relates he understands Dr. Katy Fitch told him "he would be blind in a couple years".  I do wonder if in fact that the statement that may been heard or intended was that without glaucoma medications the eye would be blind in the left eye without glaucoma medications or therapy.  Nonetheless I leave clarification of this, to [facility] eye care.  3.  OD, blind painless eye.  I would think atropine may be appropriate use in this right eye.  He however explained that he is using atropine in the left eye.  After further discussion the patient in fact is using a pink top in the left eye and not a red top.  Thus he does not need atropine going forward as the right eye is comfortable  4.  Panuveitis history left eye, patient continues on ketorolac twice daily as well as Durezol once daily.  Ophthalmic Meds  Ordered this visit:  No orders of the defined types were placed in this encounter.      Return in about 6 months (around 11/08/2021) for dilate, OS, COLOR FP, OCT.  There are no Patient Instructions on file for this visit.   Explained the diagnoses, plan, and follow up with the patient and they expressed understanding.  Patient expressed understanding of the importance of proper follow up care.   Clent Demark Avalynne Diver M.D. Diseases & Surgery of the Retina and Vitreous [facility] [place] 05/11/21     Abbreviations: M myopia (nearsighted); A astigmatism; H hyperopia (farsighted); P presbyopia; Mrx spectacle prescription;  CTL contact lenses; OD right eye; OS left eye; OU both eyes  XT exotropia; ET esotropia; PEK punctate epithelial keratitis; PEE punctate epithelial erosions; DES dry eye syndrome; MGD meibomian gland dysfunction; ATs artificial tears; PFAT's preservative free artificial tears; [place] nuclear sclerotic cataract; PSC posterior subcapsular cataract; ERM epi-retinal membrane; PVD posterior vitreous detachment; RD retinal detachment; DM diabetes mellitus; DR diabetic retinopathy; NPDR non-proliferative diabetic retinopathy; PDR proliferative diabetic retinopathy; CSME clinically significant macular edema; DME diabetic macular edema; dbh dot blot hemorrhages; CWS cotton wool spot; POAG primary open angle glaucoma; C/D cup-to-disc  ratio; HVF humphrey visual field; GVF goldmann visual field; OCT optical coherence tomography; IOP intraocular pressure; BRVO Branch retinal vein occlusion; CRVO central retinal vein occlusion; CRAO central retinal artery occlusion; BRAO branch retinal artery occlusion; RT retinal tear; SB scleral buckle; PPV pars plana vitrectomy; VH Vitreous hemorrhage; PRP panretinal laser photocoagulation; IVK intravitreal kenalog; VMT vitreomacular traction; MH Macular hole;  NVD neovascularization of the disc; NVE neovascularization elsewhere; AREDS age related eye  disease study; ARMD age related macular degeneration; POAG primary open angle glaucoma; EBMD epithelial/anterior basement membrane dystrophy; ACIOL anterior chamber intraocular lens; IOL intraocular lens; PCIOL posterior chamber intraocular lens; Phaco/IOL phacoemulsification with intraocular lens placement; [place] photorefractive keratectomy; LASIK laser assisted in situ keratomileusis; HTN hypertension; DM diabetes mellitus; COPD chronic obstructive pulmonary disease

## 2021-05-11 NOTE — Assessment & Plan Note (Signed)
OS, patient continues on Acular twice daily with excellent effect preventing recurrence of CME which was strikingly active in 2019.  My opinion the patient does not need atropine use in the left eye.  If okay with Dr. Katy Fitch okay to discontinue atropine use in the left eye he says he is using

## 2021-06-07 ENCOUNTER — Ambulatory Visit: Payer: PPO | Admitting: Podiatry

## 2021-06-12 DIAGNOSIS — R8271 Bacteriuria: Secondary | ICD-10-CM | POA: Diagnosis not present

## 2021-06-12 DIAGNOSIS — N5201 Erectile dysfunction due to arterial insufficiency: Secondary | ICD-10-CM | POA: Diagnosis not present

## 2021-06-12 DIAGNOSIS — N500 Atrophy of testis: Secondary | ICD-10-CM | POA: Diagnosis not present

## 2021-06-12 DIAGNOSIS — Z8546 Personal history of malignant neoplasm of prostate: Secondary | ICD-10-CM | POA: Diagnosis not present

## 2021-06-12 DIAGNOSIS — N50811 Right testicular pain: Secondary | ICD-10-CM | POA: Diagnosis not present

## 2021-06-19 DIAGNOSIS — N50811 Right testicular pain: Secondary | ICD-10-CM | POA: Diagnosis not present

## 2021-06-27 DIAGNOSIS — N50811 Right testicular pain: Secondary | ICD-10-CM | POA: Diagnosis not present

## 2021-06-27 DIAGNOSIS — M62838 Other muscle spasm: Secondary | ICD-10-CM | POA: Diagnosis not present

## 2021-06-27 DIAGNOSIS — N393 Stress incontinence (female) (male): Secondary | ICD-10-CM | POA: Diagnosis not present

## 2021-07-14 ENCOUNTER — Encounter: Payer: Self-pay | Admitting: Podiatry

## 2021-07-14 ENCOUNTER — Ambulatory Visit: Payer: PPO | Admitting: Podiatry

## 2021-07-14 DIAGNOSIS — Q828 Other specified congenital malformations of skin: Secondary | ICD-10-CM | POA: Diagnosis not present

## 2021-07-14 DIAGNOSIS — M79674 Pain in right toe(s): Secondary | ICD-10-CM | POA: Diagnosis not present

## 2021-07-14 DIAGNOSIS — B351 Tinea unguium: Secondary | ICD-10-CM

## 2021-07-14 DIAGNOSIS — M79675 Pain in left toe(s): Secondary | ICD-10-CM | POA: Diagnosis not present

## 2021-07-14 DIAGNOSIS — I739 Peripheral vascular disease, unspecified: Secondary | ICD-10-CM

## 2021-07-17 DIAGNOSIS — N50811 Right testicular pain: Secondary | ICD-10-CM | POA: Diagnosis not present

## 2021-07-21 DIAGNOSIS — I10 Essential (primary) hypertension: Secondary | ICD-10-CM | POA: Diagnosis not present

## 2021-07-23 NOTE — Progress Notes (Signed)
?  Subjective:  ?Patient ID: Joel Howell, male    DOB: 07-Mar-1941,  MRN: [id number] ? ?Joel Howell presents to clinic today for for at risk foot care. Patient has h/o PAD and painful elongated mycotic toenails 1-5 bilaterally which are tender when wearing enclosed shoe gear. Pain is relieved with periodic professional debridement. ? ?New problem(s): None. ? ?PCP is Lavone Orn, MD , and last visit was March 24, 2021. ? ?Allergies  ?Allergen Reactions  ? Penicillins Swelling and Rash  ?  Swelling of tongue  ? Penicillin G   ?  Other reaction(s): tongue swells ?Other reaction(s): tongue swells  ? Shellfish-Derived Products   ?  Other reaction(s): tongue swelling ?Other reaction(s): tongue swelling  ? ? ?Review of Systems: Negative except as noted in the HPI. ? ?Objective: No changes noted in today's physical examination. ?Constitutional Joel Howell is a pleasant 81 y.o. African American male, WD, WN in NAD. AAO x 3.   ?Vascular CFT <4 seconds b/l LE. Faintly palpable DP pulse(s) RLE. Faintly palpable PT pulse(s) right lower extremity. Nonpalpable pedal pulses LLE. Pedal hair absent. No pain with calf compression b/l. Lower extremity skin temperature gradient within normal limits. No edema noted b/l LE. No cyanosis or clubbing noted b/l LE.   ?Neurologic Normal speech. Oriented to person, place, and time. Protective sensation intact 5/5 intact bilaterally with 10g monofilament b/l. Vibratory sensation intact b/l.  ?Dermatologic Pedal skin thin, shiny and atrophic b/l LE. No open wounds b/l LE. No interdigital macerations noted b/l LE. Toenails 1-5 b/l elongated, discolored, dystrophic, thickened, crumbly with subungual debris and tenderness to dorsal palpation. Porokeratotic lesion(s) submet head 2 left foot. No erythema, no edema, no drainage, no fluctuance.   ?Orthopedic: Normal muscle strength 5/5 to all lower extremity muscle groups bilaterally. Pes planus deformity noted bilateral LE. No pain,  crepitus or joint limitation noted with ROM b/l LE.  Patient ambulates independently without assistive aids.  ? ?Radiographs: None ? ?Assessment/Plan: ?1. Pain due to onychomycosis of toenail   ?2. Porokeratosis   ?3. PAD (peripheral artery disease) ([place])   ?  ?-Examined patient. ?-Patient to continue soft, supportive shoe gear daily. ?-Toenails 1-5 b/l were debrided in length and girth with sterile nail nippers and dremel without iatrogenic bleeding.  ?-Painful porokeratotic lesion(s) submet head 2 left foot pared and enucleated with sterile scalpel blade without incident. Total number of lesions debrided=1. ?-Patient/POA to call should there be question/concern in the interim.  ? ?Return in about 3 months (around 10/13/2021). ? ?Marzetta Board, DPM  ?

## 2021-08-09 DIAGNOSIS — Z03818 Encounter for observation for suspected exposure to other biological agents ruled out: Secondary | ICD-10-CM | POA: Diagnosis not present

## 2021-08-09 DIAGNOSIS — I10 Essential (primary) hypertension: Secondary | ICD-10-CM | POA: Diagnosis not present

## 2021-08-09 DIAGNOSIS — R051 Acute cough: Secondary | ICD-10-CM | POA: Diagnosis not present

## 2021-08-30 DIAGNOSIS — Z961 Presence of intraocular lens: Secondary | ICD-10-CM | POA: Diagnosis not present

## 2021-08-30 DIAGNOSIS — H0102B Squamous blepharitis left eye, upper and lower eyelids: Secondary | ICD-10-CM | POA: Diagnosis not present

## 2021-08-30 DIAGNOSIS — H35352 Cystoid macular degeneration, left eye: Secondary | ICD-10-CM | POA: Diagnosis not present

## 2021-08-30 DIAGNOSIS — H401133 Primary open-angle glaucoma, bilateral, severe stage: Secondary | ICD-10-CM | POA: Diagnosis not present

## 2021-08-30 DIAGNOSIS — H2511 Age-related nuclear cataract, right eye: Secondary | ICD-10-CM | POA: Diagnosis not present

## 2021-08-30 DIAGNOSIS — H43812 Vitreous degeneration, left eye: Secondary | ICD-10-CM | POA: Diagnosis not present

## 2021-08-30 DIAGNOSIS — H0102A Squamous blepharitis right eye, upper and lower eyelids: Secondary | ICD-10-CM | POA: Diagnosis not present

## 2021-09-06 ENCOUNTER — Ambulatory Visit
Admission: RE | Admit: 2021-09-06 | Discharge: 2021-09-06 | Disposition: A | Discharge status: Home / Self Care | Payer: PPO | Source: Ambulatory Visit | Attending: Physician Assistant | Admitting: Physician Assistant

## 2021-09-06 ENCOUNTER — Other Ambulatory Visit: Payer: Self-pay | Admitting: Physician Assistant

## 2021-09-06 DIAGNOSIS — D649 Anemia, unspecified: Secondary | ICD-10-CM | POA: Diagnosis not present

## 2021-09-06 DIAGNOSIS — Z87891 Personal history of nicotine dependence: Secondary | ICD-10-CM | POA: Diagnosis not present

## 2021-09-06 DIAGNOSIS — R051 Acute cough: Secondary | ICD-10-CM

## 2021-09-06 DIAGNOSIS — R059 Cough, unspecified: Secondary | ICD-10-CM | POA: Diagnosis not present

## 2021-09-06 DIAGNOSIS — I10 Essential (primary) hypertension: Secondary | ICD-10-CM | POA: Diagnosis not present

## 2021-09-06 DIAGNOSIS — J4520 Mild intermittent asthma, uncomplicated: Secondary | ICD-10-CM

## 2021-09-07 ENCOUNTER — Other Ambulatory Visit: Payer: Self-pay | Admitting: Internal Medicine

## 2021-09-07 DIAGNOSIS — R9389 Abnormal findings on diagnostic imaging of other specified body structures: Secondary | ICD-10-CM

## 2021-09-07 DIAGNOSIS — R911 Solitary pulmonary nodule: Secondary | ICD-10-CM

## 2021-09-14 DIAGNOSIS — N50811 Right testicular pain: Secondary | ICD-10-CM | POA: Diagnosis not present

## 2021-09-14 DIAGNOSIS — M62838 Other muscle spasm: Secondary | ICD-10-CM | POA: Diagnosis not present

## 2021-09-14 DIAGNOSIS — N393 Stress incontinence (female) (male): Secondary | ICD-10-CM | POA: Diagnosis not present

## 2021-09-20 DIAGNOSIS — Z87440 Personal history of urinary (tract) infections: Secondary | ICD-10-CM | POA: Diagnosis not present

## 2021-09-20 DIAGNOSIS — N50811 Right testicular pain: Secondary | ICD-10-CM | POA: Diagnosis not present

## 2021-09-20 DIAGNOSIS — N5201 Erectile dysfunction due to arterial insufficiency: Secondary | ICD-10-CM | POA: Diagnosis not present

## 2021-09-20 DIAGNOSIS — N500 Atrophy of testis: Secondary | ICD-10-CM | POA: Diagnosis not present

## 2021-09-22 DIAGNOSIS — N50811 Right testicular pain: Secondary | ICD-10-CM | POA: Diagnosis not present

## 2021-10-06 ENCOUNTER — Ambulatory Visit
Admission: RE | Admit: 2021-10-06 | Discharge: 2021-10-06 | Disposition: A | Discharge status: Home / Self Care | Payer: PPO | Source: Ambulatory Visit | Attending: Internal Medicine | Admitting: Internal Medicine

## 2021-10-06 DIAGNOSIS — I700 Atherosclerosis of aorta: Secondary | ICD-10-CM | POA: Diagnosis not present

## 2021-10-06 DIAGNOSIS — R911 Solitary pulmonary nodule: Secondary | ICD-10-CM

## 2021-10-06 DIAGNOSIS — R9389 Abnormal findings on diagnostic imaging of other specified body structures: Secondary | ICD-10-CM

## 2021-10-06 DIAGNOSIS — J9859 Other diseases of mediastinum, not elsewhere classified: Secondary | ICD-10-CM | POA: Diagnosis not present

## 2021-10-06 DIAGNOSIS — J8410 Pulmonary fibrosis, unspecified: Secondary | ICD-10-CM | POA: Diagnosis not present

## 2021-10-06 DIAGNOSIS — R918 Other nonspecific abnormal finding of lung field: Secondary | ICD-10-CM | POA: Diagnosis not present

## 2021-10-06 MED ORDER — IOPAMIDOL (ISOVUE-300) INJECTION 61%
75.0000 mL | Freq: Once | INTRAVENOUS | Status: AC | PRN
  Administered 2021-10-06: 75 mL via INTRAVENOUS

## 2021-10-12 DIAGNOSIS — R102 Pelvic and perineal pain: Secondary | ICD-10-CM | POA: Diagnosis not present

## 2021-10-12 DIAGNOSIS — M6281 Muscle weakness (generalized): Secondary | ICD-10-CM | POA: Diagnosis not present

## 2021-10-12 DIAGNOSIS — M62838 Other muscle spasm: Secondary | ICD-10-CM | POA: Diagnosis not present

## 2021-10-12 DIAGNOSIS — N393 Stress incontinence (female) (male): Secondary | ICD-10-CM | POA: Diagnosis not present

## 2021-10-17 ENCOUNTER — Ambulatory Visit (INDEPENDENT_AMBULATORY_CARE_PROVIDER_SITE_OTHER): Payer: PPO | Admitting: Podiatry

## 2021-10-17 ENCOUNTER — Encounter: Payer: Self-pay | Admitting: Podiatry

## 2021-10-17 DIAGNOSIS — I739 Peripheral vascular disease, unspecified: Secondary | ICD-10-CM | POA: Diagnosis not present

## 2021-10-17 DIAGNOSIS — Q828 Other specified congenital malformations of skin: Secondary | ICD-10-CM

## 2021-10-17 DIAGNOSIS — M79676 Pain in unspecified toe(s): Secondary | ICD-10-CM

## 2021-10-17 DIAGNOSIS — R911 Solitary pulmonary nodule: Secondary | ICD-10-CM | POA: Insufficient documentation

## 2021-10-17 DIAGNOSIS — B351 Tinea unguium: Secondary | ICD-10-CM | POA: Diagnosis not present

## 2021-10-22 NOTE — Progress Notes (Signed)
  Subjective:  Patient ID: Joel Howell, male    DOB: 1940/08/11,  MRN: [id number]  OSHAE SIMMERING presents to clinic today for for at risk foot care. Patient has h/o PAD and painful porokeratotic lesion(s) left lower extremity and painful mycotic toenails that limit ambulation. Painful toenails interfere with ambulation. Aggravating factors include wearing enclosed shoe gear. Pain is relieved with periodic professional debridement. Painful porokeratotic lesions are aggravated when weightbearing with and without shoegear. Pain is relieved with periodic professional debridement.  New problem(s): None.   PCP is Lavone Orn, MD , and last visit was  Aug 09, 2021  Allergies  Allergen Reactions   Penicillins Swelling and Rash    Swelling of tongue   Penicillin G     Other reaction(s): tongue swells Other reaction(s): tongue swells   Shellfish-Derived Products     Other reaction(s): tongue swelling Other reaction(s): tongue swelling    Review of Systems: Negative except as noted in the HPI.  Objective: No changes noted in today's physical examination.  Constitutional Joel Howell is a pleasant 81 y.o. African American male, WD, WN in NAD. AAO x 3.   Vascular CFT <4 seconds b/l LE. Faintly palpable DP pulse(s) RLE. Faintly palpable PT pulse(s) right lower extremity. Nonpalpable pedal pulses LLE. Pedal hair absent. No pain with calf compression b/l. Lower extremity skin temperature gradient within normal limits. No edema noted b/l LE. No cyanosis or clubbing noted b/l LE.   Neurologic Normal speech. Oriented to person, place, and time. Protective sensation intact 5/5 intact bilaterally with 10g monofilament b/l. Vibratory sensation intact b/l.  Dermatologic Pedal skin thin, shiny and atrophic b/l LE. No open wounds b/l LE. No interdigital macerations noted b/l LE. Toenails 1-5 b/l elongated, discolored, dystrophic, thickened, crumbly with subungual debris and tenderness to dorsal  palpation. Porokeratotic lesion(s) submet head 2 left foot. No erythema, no edema, no drainage, no fluctuance.   Orthopedic: Normal muscle strength 5/5 to all lower extremity muscle groups bilaterally. Pes planus deformity noted bilateral LE. No pain, crepitus or joint limitation noted with ROM b/l LE.  Patient ambulates independently without assistive aids.   Radiographs: None  Assessment/Plan: 1. Pain due to onychomycosis of toenail   2. Porokeratosis   3. PAD (peripheral artery disease) ([place])   -Patient was evaluated and treated. All patient's and/or POA's questions/concerns answered on today's visit. -Patient to continue soft, supportive shoe gear daily. -Mycotic toenails 1-5 bilaterally were debrided in length and girth with sterile nail nippers and dremel. Pinpoint bleeding of L 3rd toe addressed with Lumicain Hemostatic Solution, cleansed with alcohol. triple antibiotic ointment applied. No further treatment required by patient/caregiver. -Porokeratotic lesion(s) submet head 2 left foot pared and enucleated with sterile scalpel blade without incident. Total number of lesions debrided=1. -Patient/POA to call should there be question/concern in the interim.   Return in about 3 months (around 01/17/2022).  Marzetta Board, DPM

## 2021-10-23 DIAGNOSIS — N50811 Right testicular pain: Secondary | ICD-10-CM | POA: Diagnosis not present

## 2021-11-02 DIAGNOSIS — R102 Pelvic and perineal pain: Secondary | ICD-10-CM | POA: Diagnosis not present

## 2021-11-02 DIAGNOSIS — M6281 Muscle weakness (generalized): Secondary | ICD-10-CM | POA: Diagnosis not present

## 2021-11-02 DIAGNOSIS — N393 Stress incontinence (female) (male): Secondary | ICD-10-CM | POA: Diagnosis not present

## 2021-11-02 DIAGNOSIS — M62838 Other muscle spasm: Secondary | ICD-10-CM | POA: Diagnosis not present

## 2021-11-03 ENCOUNTER — Other Ambulatory Visit: Payer: Self-pay | Admitting: Internal Medicine

## 2021-11-03 DIAGNOSIS — D7389 Other diseases of spleen: Secondary | ICD-10-CM

## 2021-11-08 ENCOUNTER — Encounter (INDEPENDENT_AMBULATORY_CARE_PROVIDER_SITE_OTHER): Payer: PPO | Admitting: Ophthalmology

## 2021-11-10 DIAGNOSIS — E042 Nontoxic multinodular goiter: Secondary | ICD-10-CM | POA: Diagnosis not present

## 2021-11-10 DIAGNOSIS — E041 Nontoxic single thyroid nodule: Secondary | ICD-10-CM | POA: Diagnosis not present

## 2021-11-16 DIAGNOSIS — R102 Pelvic and perineal pain: Secondary | ICD-10-CM | POA: Diagnosis not present

## 2021-11-16 DIAGNOSIS — M6281 Muscle weakness (generalized): Secondary | ICD-10-CM | POA: Diagnosis not present

## 2021-11-16 DIAGNOSIS — M62838 Other muscle spasm: Secondary | ICD-10-CM | POA: Diagnosis not present

## 2021-11-16 DIAGNOSIS — N393 Stress incontinence (female) (male): Secondary | ICD-10-CM | POA: Diagnosis not present

## 2021-11-23 DIAGNOSIS — N50811 Right testicular pain: Secondary | ICD-10-CM | POA: Diagnosis not present

## 2021-11-23 DIAGNOSIS — M62838 Other muscle spasm: Secondary | ICD-10-CM | POA: Diagnosis not present

## 2021-11-23 DIAGNOSIS — M6281 Muscle weakness (generalized): Secondary | ICD-10-CM | POA: Diagnosis not present

## 2021-12-01 ENCOUNTER — Telehealth: Payer: Self-pay

## 2021-12-01 NOTE — Patient Outreach (Signed)
  Care Coordination   12/01/2021 Name: Joel Howell MRN: [id number] DOB: 03/28/1940   Care Coordination Outreach Attempts:  An unsuccessful telephone outreach was attempted today to offer the patient information about available care coordination services as a benefit of their health plan.   Follow Up Plan:  Additional outreach attempts will be made to offer the patient care coordination information and services.   Encounter Outcome:  No Answer  Care Coordination Interventions Activated:  No   Care Coordination Interventions:  No, not indicated    [facility] [phone]

## 2021-12-05 ENCOUNTER — Other Ambulatory Visit: Payer: Self-pay | Admitting: Internal Medicine

## 2021-12-05 DIAGNOSIS — E042 Nontoxic multinodular goiter: Secondary | ICD-10-CM

## 2021-12-08 ENCOUNTER — Ambulatory Visit
Admission: RE | Admit: 2021-12-08 | Discharge: 2021-12-08 | Disposition: A | Discharge status: Home / Self Care | Payer: PPO | Source: Ambulatory Visit | Attending: Internal Medicine | Admitting: Internal Medicine

## 2021-12-08 DIAGNOSIS — D7389 Other diseases of spleen: Secondary | ICD-10-CM

## 2021-12-08 DIAGNOSIS — K449 Diaphragmatic hernia without obstruction or gangrene: Secondary | ICD-10-CM | POA: Diagnosis not present

## 2021-12-08 DIAGNOSIS — K7689 Other specified diseases of liver: Secondary | ICD-10-CM | POA: Diagnosis not present

## 2021-12-08 DIAGNOSIS — R161 Splenomegaly, not elsewhere classified: Secondary | ICD-10-CM | POA: Diagnosis not present

## 2021-12-08 MED ORDER — GADOBENATE DIMEGLUMINE 529 MG/ML IV SOLN
20.0000 mL | Freq: Once | INTRAVENOUS | Status: AC | PRN
  Administered 2021-12-08: 20 mL via INTRAVENOUS

## 2021-12-11 ENCOUNTER — Ambulatory Visit
Admission: RE | Admit: 2021-12-11 | Discharge: 2021-12-11 | Disposition: A | Discharge status: Home / Self Care | Payer: PPO | Source: Ambulatory Visit | Attending: Internal Medicine | Admitting: Internal Medicine

## 2021-12-11 ENCOUNTER — Other Ambulatory Visit (HOSPITAL_COMMUNITY)
Admission: RE | Admit: 2021-12-11 | Discharge: 2021-12-11 | Disposition: A | Discharge status: Home / Self Care | Payer: PPO | Source: Ambulatory Visit | Attending: Interventional Radiology | Admitting: Interventional Radiology

## 2021-12-11 DIAGNOSIS — E042 Nontoxic multinodular goiter: Secondary | ICD-10-CM | POA: Diagnosis not present

## 2021-12-11 DIAGNOSIS — E0789 Other specified disorders of thyroid: Secondary | ICD-10-CM | POA: Diagnosis not present

## 2021-12-11 DIAGNOSIS — E041 Nontoxic single thyroid nodule: Secondary | ICD-10-CM | POA: Diagnosis not present

## 2021-12-12 ENCOUNTER — Other Ambulatory Visit: Payer: PPO

## 2021-12-12 ENCOUNTER — Encounter (INDEPENDENT_AMBULATORY_CARE_PROVIDER_SITE_OTHER): Payer: PPO | Admitting: Ophthalmology

## 2021-12-18 LAB — CYTOLOGY - NON PAP

## 2021-12-23 DIAGNOSIS — E042 Nontoxic multinodular goiter: Secondary | ICD-10-CM | POA: Diagnosis not present

## 2022-01-09 ENCOUNTER — Encounter (HOSPITAL_COMMUNITY): Payer: Self-pay

## 2022-01-10 ENCOUNTER — Other Ambulatory Visit: Payer: Self-pay | Admitting: Internal Medicine

## 2022-01-30 ENCOUNTER — Ambulatory Visit (INDEPENDENT_AMBULATORY_CARE_PROVIDER_SITE_OTHER): Payer: PPO | Admitting: Podiatry

## 2022-01-30 DIAGNOSIS — M79676 Pain in unspecified toe(s): Secondary | ICD-10-CM

## 2022-01-30 DIAGNOSIS — I739 Peripheral vascular disease, unspecified: Secondary | ICD-10-CM

## 2022-01-30 DIAGNOSIS — Q828 Other specified congenital malformations of skin: Secondary | ICD-10-CM

## 2022-01-30 DIAGNOSIS — B351 Tinea unguium: Secondary | ICD-10-CM | POA: Diagnosis not present

## 2022-01-30 NOTE — Progress Notes (Signed)
  Subjective:  Patient ID: Joel Howell, male    DOB: [birth year],  MRN: [id number]  Joel Howell presents to clinic today for at risk foot care. Patient has h/o PAD and painful porokeratotic lesion(s) left lower extremity and painful mycotic toenails that limit ambulation. Painful toenails interfere with ambulation. Aggravating factors include wearing enclosed shoe gear. Pain is relieved with periodic professional debridement. Painful porokeratotic lesions are aggravated when weightbearing with and without shoegear. Pain is relieved with periodic professional debridement.  Chief Complaint  Patient presents with   Nail Problem    RFC Bilateral nail trim. Pt not diabetic.    New problem(s): None.   PCP is Lavone Orn, MD , and last visit was November 10, 2021.  Allergies  Allergen Reactions   Penicillins Swelling and Rash    Swelling of tongue   Penicillin G     Other reaction(s): tongue swells Other reaction(s): tongue swells   Shellfish-Derived Products     Other reaction(s): tongue swelling Other reaction(s): tongue swelling    Review of Systems: Negative except as noted in the HPI.  Objective: No changes noted in today's physical examination.  Joel Howell is a pleasant 81 y.o. male WD, WN in NAD. AAO x 3.  Vascular CFT <4 seconds b/l LE. Faintly palpable DP pulse(s) RLE. Faintly palpable PT pulse(s) right lower extremity. Nonpalpable pedal pulses LLE. Pedal hair absent. No pain with calf compression b/l. Lower extremity skin temperature gradient within normal limits. No edema noted b/l LE. No cyanosis or clubbing noted b/l LE.   Neurologic Normal speech. Oriented to person, place, and time. Protective sensation intact 5/5 intact bilaterally with 10g monofilament b/l. Vibratory sensation intact b/l.  Dermatologic Pedal skin thin, shiny and atrophic b/l LE. No open wounds b/l LE. No interdigital macerations noted b/l LE. Toenails 1-5 b/l elongated, discolored, dystrophic,  thickened, crumbly with subungual debris and tenderness to dorsal palpation. Porokeratotic lesion(s) submet head 2 left foot. No erythema, no edema, no drainage, no fluctuance.   Orthopedic: Normal muscle strength 5/5 to all lower extremity muscle groups bilaterally. Pes planus deformity noted bilateral LE. No pain, crepitus or joint limitation noted with ROM b/l LE.  Patient ambulates independently without assistive aids.   Radiographs: None  Assessment/Plan: 1. Pain due to onychomycosis of toenail   2. Porokeratosis   3. PAD (peripheral artery disease) ([facility])     No orders of the defined types were placed in this encounter.   -Consent given for treatment as described below: -Examined patient. -Toenails 1-5 b/l were debrided in length and girth with sterile nail nippers and dremel without iatrogenic bleeding.  -Porokeratotic lesion(s) submet head 2 left foot pared and enucleated with sterile currette without incident. Total number of lesions debrided=1. -Patient/POA to call should there be question/concern in the interim.   Return in about 4 months (around 05/31/2022).  Marzetta Board, DPM

## 2022-02-04 ENCOUNTER — Encounter: Payer: Self-pay | Admitting: Podiatry

## 2022-02-06 DIAGNOSIS — I10 Essential (primary) hypertension: Secondary | ICD-10-CM | POA: Diagnosis not present

## 2022-02-06 DIAGNOSIS — D7389 Other diseases of spleen: Secondary | ICD-10-CM | POA: Diagnosis not present

## 2022-02-06 DIAGNOSIS — Z86010 Personal history of colonic polyps: Secondary | ICD-10-CM | POA: Diagnosis not present

## 2022-02-06 DIAGNOSIS — E7800 Pure hypercholesterolemia, unspecified: Secondary | ICD-10-CM | POA: Diagnosis not present

## 2022-02-06 DIAGNOSIS — Z8546 Personal history of malignant neoplasm of prostate: Secondary | ICD-10-CM | POA: Diagnosis not present

## 2022-02-06 DIAGNOSIS — Z1331 Encounter for screening for depression: Secondary | ICD-10-CM | POA: Diagnosis not present

## 2022-02-06 DIAGNOSIS — Z23 Encounter for immunization: Secondary | ICD-10-CM | POA: Diagnosis not present

## 2022-02-06 DIAGNOSIS — Z79899 Other long term (current) drug therapy: Secondary | ICD-10-CM | POA: Diagnosis not present

## 2022-02-06 DIAGNOSIS — E042 Nontoxic multinodular goiter: Secondary | ICD-10-CM | POA: Diagnosis not present

## 2022-02-06 DIAGNOSIS — Z0000 Encounter for general adult medical examination without abnormal findings: Secondary | ICD-10-CM | POA: Diagnosis not present

## 2022-02-13 DIAGNOSIS — E875 Hyperkalemia: Secondary | ICD-10-CM | POA: Diagnosis not present

## 2022-02-26 DIAGNOSIS — H35352 Cystoid macular degeneration, left eye: Secondary | ICD-10-CM | POA: Diagnosis not present

## 2022-02-26 DIAGNOSIS — H401123 Primary open-angle glaucoma, left eye, severe stage: Secondary | ICD-10-CM | POA: Diagnosis not present

## 2022-02-26 DIAGNOSIS — H2012 Chronic iridocyclitis, left eye: Secondary | ICD-10-CM | POA: Diagnosis not present

## 2022-03-08 DIAGNOSIS — H548 Legal blindness, as defined in USA: Secondary | ICD-10-CM | POA: Diagnosis not present

## 2022-03-08 DIAGNOSIS — E559 Vitamin D deficiency, unspecified: Secondary | ICD-10-CM | POA: Diagnosis not present

## 2022-03-08 DIAGNOSIS — H9193 Unspecified hearing loss, bilateral: Secondary | ICD-10-CM | POA: Diagnosis not present

## 2022-03-08 DIAGNOSIS — I10 Essential (primary) hypertension: Secondary | ICD-10-CM | POA: Diagnosis not present

## 2022-03-08 DIAGNOSIS — I739 Peripheral vascular disease, unspecified: Secondary | ICD-10-CM | POA: Diagnosis not present

## 2022-03-08 DIAGNOSIS — G8929 Other chronic pain: Secondary | ICD-10-CM | POA: Diagnosis not present

## 2022-03-08 DIAGNOSIS — J45909 Unspecified asthma, uncomplicated: Secondary | ICD-10-CM | POA: Diagnosis not present

## 2022-03-08 DIAGNOSIS — N529 Male erectile dysfunction, unspecified: Secondary | ICD-10-CM | POA: Diagnosis not present

## 2022-03-08 DIAGNOSIS — H409 Unspecified glaucoma: Secondary | ICD-10-CM | POA: Diagnosis not present

## 2022-03-08 DIAGNOSIS — E785 Hyperlipidemia, unspecified: Secondary | ICD-10-CM | POA: Diagnosis not present

## 2022-03-08 DIAGNOSIS — E663 Overweight: Secondary | ICD-10-CM | POA: Diagnosis not present

## 2022-03-08 DIAGNOSIS — F331 Major depressive disorder, recurrent, moderate: Secondary | ICD-10-CM | POA: Diagnosis not present

## 2022-04-10 DIAGNOSIS — M25562 Pain in left knee: Secondary | ICD-10-CM | POA: Diagnosis not present

## 2022-04-10 DIAGNOSIS — M170 Bilateral primary osteoarthritis of knee: Secondary | ICD-10-CM | POA: Diagnosis not present

## 2022-04-10 DIAGNOSIS — M1712 Unilateral primary osteoarthritis, left knee: Secondary | ICD-10-CM | POA: Diagnosis not present

## 2022-04-10 DIAGNOSIS — M1711 Unilateral primary osteoarthritis, right knee: Secondary | ICD-10-CM | POA: Diagnosis not present

## 2022-05-21 DIAGNOSIS — M1712 Unilateral primary osteoarthritis, left knee: Secondary | ICD-10-CM | POA: Insufficient documentation

## 2022-06-01 ENCOUNTER — Ambulatory Visit: Payer: PPO | Admitting: Podiatry

## 2022-06-04 ENCOUNTER — Other Ambulatory Visit: Payer: Self-pay | Admitting: Internal Medicine

## 2022-06-04 DIAGNOSIS — D7389 Other diseases of spleen: Secondary | ICD-10-CM

## 2022-06-08 ENCOUNTER — Ambulatory Visit (INDEPENDENT_AMBULATORY_CARE_PROVIDER_SITE_OTHER): Payer: PPO | Admitting: Podiatry

## 2022-06-08 VITALS — BP 148/70 | HR 97

## 2022-06-08 DIAGNOSIS — M79676 Pain in unspecified toe(s): Secondary | ICD-10-CM | POA: Diagnosis not present

## 2022-06-08 DIAGNOSIS — B351 Tinea unguium: Secondary | ICD-10-CM | POA: Diagnosis not present

## 2022-06-08 DIAGNOSIS — Q828 Other specified congenital malformations of skin: Secondary | ICD-10-CM

## 2022-06-08 DIAGNOSIS — I739 Peripheral vascular disease, unspecified: Secondary | ICD-10-CM

## 2022-06-08 NOTE — Progress Notes (Unsigned)
  Subjective:  Patient ID: Joel Howell, male    DOB: 03-28-1940,  MRN: [id number]  Joel Howell presents to clinic today for {jgcomplaint:23593}  Chief Complaint  Patient presents with   Nail Problem     RAJU, SNEHA-pcp-12/23 last seen,not diabetic   New problem(s): None. {jgcomplaint:23593}  PCP is Lavone Orn, MD.  Allergies  Allergen Reactions   Penicillins Swelling and Rash    Swelling of tongue   Penicillin G     Other reaction(s): tongue swells Other reaction(s): tongue swells   Shellfish-Derived Products     Other reaction(s): tongue swelling Other reaction(s): tongue swelling    Review of Systems: Negative except as noted in the HPI.  Objective: No changes noted in today's physical examination. Vitals:   06/08/22 0953  BP: (!) 148/70  Pulse: 97   Joel Howell is a pleasant 82 y.o. male obese in NAD. AAO x 3. Vascular CFT <4 seconds b/l LE. Faintly palpable DP pulse(s) RLE. Faintly palpable PT pulse(s) right lower extremity. Nonpalpable pedal pulses LLE. Pedal hair absent. No pain with calf compression b/l. Lower extremity skin temperature gradient within normal limits. No edema noted b/l LE. No cyanosis or clubbing noted b/l LE.   Neurologic Normal speech. Oriented to person, place, and time. Protective sensation intact 5/5 intact bilaterally with 10g monofilament b/l. Vibratory sensation intact b/l.  Dermatologic Pedal skin thin, shiny and atrophic b/l LE. No open wounds b/l LE. No interdigital macerations noted b/l LE. Toenails 1-5 b/l elongated, discolored, dystrophic, thickened, crumbly with subungual debris and tenderness to dorsal palpation.   Porokeratotic lesion(s) submet head 2 left foot. No erythema, no edema, no drainage, no fluctuance.   Orthopedic: Normal muscle strength 5/5 to all lower extremity muscle groups bilaterally. Pes planus deformity noted bilateral LE. No pain, crepitus or joint limitation noted with ROM b/l LE.  Patient ambulates  independently without assistive aids.   Assessment/Plan: 1. Pain due to onychomycosis of toenail   2. Porokeratosis   3. PAD (peripheral artery disease) ([place])     No orders of the defined types were placed in this encounter.   None {Jgplan:[id number]::"-Patient/POA to call should there be question/concern in the interim."}   Return in about 3 months (around 09/08/2022).  Marzetta Board, DPM

## 2022-06-10 ENCOUNTER — Encounter: Payer: Self-pay | Admitting: Podiatry

## 2022-07-02 ENCOUNTER — Other Ambulatory Visit: Payer: Self-pay | Admitting: Internal Medicine

## 2022-07-02 DIAGNOSIS — D7389 Other diseases of spleen: Secondary | ICD-10-CM

## 2022-07-30 ENCOUNTER — Ambulatory Visit
Admission: RE | Admit: 2022-07-30 | Discharge: 2022-07-30 | Disposition: A | Discharge status: Home / Self Care | Payer: PPO | Source: Ambulatory Visit | Attending: Internal Medicine | Admitting: Internal Medicine

## 2022-07-30 DIAGNOSIS — D7389 Other diseases of spleen: Secondary | ICD-10-CM

## 2022-07-30 MED ORDER — GADOPICLENOL 0.5 MMOL/ML IV SOLN
7.5000 mL | Freq: Once | INTRAVENOUS | Status: AC | PRN
  Administered 2022-07-30: 7.5 mL via INTRAVENOUS

## 2022-10-16 ENCOUNTER — Ambulatory Visit: Payer: PPO | Admitting: Podiatry

## 2022-10-16 ENCOUNTER — Encounter: Payer: Self-pay | Admitting: Podiatry

## 2022-10-16 DIAGNOSIS — Q828 Other specified congenital malformations of skin: Secondary | ICD-10-CM | POA: Diagnosis not present

## 2022-10-16 DIAGNOSIS — M79676 Pain in unspecified toe(s): Secondary | ICD-10-CM | POA: Diagnosis not present

## 2022-10-16 DIAGNOSIS — I739 Peripheral vascular disease, unspecified: Secondary | ICD-10-CM

## 2022-10-16 DIAGNOSIS — B351 Tinea unguium: Secondary | ICD-10-CM

## 2022-10-21 NOTE — Progress Notes (Signed)
  Subjective:  Patient ID: Joel Howell, male    DOB: 1940/09/19,  MRN: [id number]  Joel Howell presents to clinic today for at risk foot care. Patient has h/o PAD and painful porokeratotic lesion(s) left foot and painful mycotic toenails that limit ambulation. Painful toenails interfere with ambulation. Aggravating factors include wearing enclosed shoe gear. Pain is relieved with periodic professional debridement. Painful porokeratotic lesions are aggravated when weightbearing with and without shoegear. Pain is relieved with periodic professional debridement.  Chief Complaint  Patient presents with   NLAIL CARE    RFC    New problem(s): None.   PCP is Kirby Funk, MD (Inactive).  Allergies  Allergen Reactions   Penicillins Swelling and Rash    Swelling of tongue   Penicillin G     Other reaction(s): tongue swells Other reaction(s): tongue swells   Shellfish-Derived Products     Other reaction(s): tongue swelling Other reaction(s): tongue swelling    Review of Systems: Negative except as noted in the HPI.  Objective: No changes noted in today's physical examination. There were no vitals filed for this visit. LARICO Howell is a pleasant 82 y.o. male WD, WN in NAD. AAO x 3.  Vascular Examination: CFT <3 seconds b/l. DP/PT  pulses faintly palpable RLE. Pedal pulses nonpalpable LLE. Digital hair absent. Skin temperature gradient warm to warm b/l. No pain with calf compression. No ischemia or gangrene. No cyanosis or clubbing noted b/l.    Neurological Examination: Sensation grossly intact b/l with 10 gram monofilament. Vibratory sensation intact b/l.   Dermatological Examination: Pedal skin thin, shiny and atrophic b/l. No open wounds b/l. No interdigital macerations. Toenails 1-5 b/l thick, discolored, elongated with subungual debris and pain on dorsal palpation.  Porokeratotic lesion(s) submet head 4 left foot. No erythema, no edema, no drainage, no  fluctuance.  Musculoskeletal Examination: Normal muscle strength 5/5 to all lower extremity muscle groups bilaterally. Plantarflexed metatarsal(s) 4th metatarsal head left foot.Marland Kitchen No pain, crepitus or joint limitation noted with ROM b/l LE.  Patient ambulates independently without assistive aids.  Radiographs: None  Assessment/Plan: 1. Pain due to onychomycosis of toenail   2. Porokeratosis   3. PAD (peripheral artery disease) (HCC)     -Consent given for treatment as described below: -Examined patient. -Patient to continue soft, supportive shoe gear daily. -Toenails 1-5 b/l were debrided in length and girth with sterile nail nippers and dremel without iatrogenic bleeding.  -Porokeratotic lesion(s) submet head 4 left foot pared and enucleated with sterile currette without incident. Total number of lesions debrided=1. -Patient/POA to call should there be question/concern in the interim.   Return in about 3 months (around 01/16/2023).  [name], DPM

## 2023-02-06 ENCOUNTER — Ambulatory Visit: Payer: PPO | Admitting: Podiatry

## 2023-02-06 ENCOUNTER — Encounter: Payer: Self-pay | Admitting: Podiatry

## 2023-02-06 DIAGNOSIS — M79676 Pain in unspecified toe(s): Secondary | ICD-10-CM | POA: Diagnosis not present

## 2023-02-06 DIAGNOSIS — Q828 Other specified congenital malformations of skin: Secondary | ICD-10-CM | POA: Diagnosis not present

## 2023-02-06 DIAGNOSIS — I739 Peripheral vascular disease, unspecified: Secondary | ICD-10-CM | POA: Diagnosis not present

## 2023-02-06 DIAGNOSIS — B351 Tinea unguium: Secondary | ICD-10-CM

## 2023-02-10 NOTE — Progress Notes (Signed)
  Subjective:  Patient ID: Joel Howell, male    DOB: 01/20/1941,  MRN: [id number]  82 y.o. male presents to clinic with  at risk foot care. Patient has h/o PAD and painful porokeratotic lesion(s) left foot and painful mycotic toenails that limit ambulation. Painful toenails interfere with ambulation. Aggravating factors include wearing enclosed shoe gear. Pain is relieved with periodic professional debridement. Painful porokeratotic lesions are aggravated when weightbearing with and without shoegear. Pain is relieved with periodic professional debridement.  Chief Complaint  Patient presents with   RFC    RFC- patient has no concerns at this time just nail trim.    New problem(s): None   PCP is Kirby Funk, MD (Inactive).  Allergies  Allergen Reactions   Penicillins Swelling and Rash    Swelling of tongue   Penicillin G     Other reaction(s): tongue swells Other reaction(s): tongue swells   Shellfish-Derived Products     Other reaction(s): tongue swelling Other reaction(s): tongue swelling    Review of Systems: Negative except as noted in the HPI.   Objective:  Joel Howell is a pleasant 82 y.o. male WD, WN in NAD.Marland Kitchen  Vascular Examination: CFT <3 seconds b/l. DP/PT  pulses faintly palpable RLE. Pedal pulses nonpalpable LLE. Digital hair absent. Skin temperature gradient warm to warm b/l. No pain with calf compression. No ischemia or gangrene. No cyanosis or clubbing noted b/l.    Neurological Examination: Sensation grossly intact b/l with 10 gram monofilament. Vibratory sensation intact b/l.   Dermatological Examination: Pedal skin thin and atrophic b/l LE. No open wounds b/l LE. No interdigital macerations noted b/l LE. Toenails 1-5 b/l elongated, discolored, dystrophic, thickened, crumbly with subungual debris and tenderness to dorsal palpation. Porokeratotic lesion(s) submet head 4 left foot. No erythema, no edema, no drainage, no fluctuance.  Musculoskeletal  Examination: Muscle strength 5/5 to b/l LE. Muscle strength 5/5 to all lower extremity muscle groups bilaterally. No pain, crepitus or joint limitation noted with ROM bilateral LE. Plantarflexed metatarsal(s) 4th metatarsal head left foot.  Radiographs: None  Last A1c:       No data to display           Assessment:   1. Pain due to onychomycosis of toenail   2. Porokeratosis   3. PAD (peripheral artery disease) (HCC)    Plan:  -Patient was evaluated today. All questions/concerns addressed on today's visit. -No new findings. No new orders. -Continue supportive shoe gear daily. -Mycotic toenails 1-5 bilaterally were debrided in length and girth with sterile nail nippers and dremel without incident. -Porokeratotic lesion(s) left foot pared and enucleated with sterile currette without incident. Total number of lesions debrided=1. -Patient/POA to call should there be question/concern in the interim.  Return in about 3 months (around 05/09/2023).  [name], DPM      [place] LOCATION: [address], [place] [address]                   Office [phone]   [facility] LOCATION: [address] [place], [place] [address] Office [phone]

## 2023-05-07 ENCOUNTER — Ambulatory Visit: Payer: PPO | Admitting: Podiatry

## 2023-05-07 DIAGNOSIS — I739 Peripheral vascular disease, unspecified: Secondary | ICD-10-CM | POA: Diagnosis not present

## 2023-05-07 DIAGNOSIS — M79674 Pain in right toe(s): Secondary | ICD-10-CM | POA: Diagnosis not present

## 2023-05-07 DIAGNOSIS — L84 Corns and callosities: Secondary | ICD-10-CM

## 2023-05-07 DIAGNOSIS — B351 Tinea unguium: Secondary | ICD-10-CM | POA: Diagnosis not present

## 2023-05-07 DIAGNOSIS — M79675 Pain in left toe(s): Secondary | ICD-10-CM

## 2023-05-07 NOTE — Progress Notes (Unsigned)
       Subjective:  Patient ID: Joel Howell, male    DOB: 11-29-1940,  MRN: [id number]  Joel Howell presents to clinic today for:  Chief Complaint  Patient presents with   rfc    Nail trim    Patient notes nails are thick and elongated, causing pain in shoe gear when ambulating.  Painful callus left submet 2.  PCP last seen  around 03/09/23  Past Medical History:  Diagnosis Date   Asthma    child ,none in adult years   Blood dyscrasia    ? was told of something, but can't remember-no precautions given   Cancer [facility]) 09-12-12   Prostate cancer '01-surgery only open   Essential hypertension 05/16/2020   Glaucoma    left eye, surgery done on right    Allergies  Allergen Reactions   Penicillins Swelling and Rash    Swelling of tongue   Penicillin G     Other reaction(s): tongue swells Other reaction(s): tongue swells   Shellfish-Derived Products     Other reaction(s): tongue swelling Other reaction(s): tongue swelling    Objective:  Joel Howell is a pleasant 83 y.o. male in NAD. AAO x 3.  Vascular Examination: Patient has palpable DP pulse, absent PT pulse bilateral.  Delayed capillary refill bilateral toes.  Sparse digital hair bilateral.  Proximal to distal cooling WNL bilateral.    Dermatological Examination: Interspaces are clear with no open lesions noted bilateral.  Skin is shiny and atrophic bilateral.  Nails are 3-27mm thick, with yellowish/brown discoloration, subungual debris and distal onycholysis x10.  There is pain with compression of nails x10.  There are hyperkeratotic lesions noted left submet 2 .  Patient qualifies for at-risk foot care because of PAD .  Assessment/Plan: 1. Pain due to onychomycosis of toenails of both feet   2. Callus of foot   3. PAD (peripheral artery disease) (HCC)    Mycotic nails x10 were sharply debrided with sterile nail nippers and power debriding burr to decrease bulk and length.  Hyperkeratotic lesion left  plantar 2nd metatarsal head was shaved with #312 blade.   Return in about 3 months (around 08/04/2023) for RFC.   Clerance Lav, DPM, FACFAS [facility]     [address] [place], [place] [address]                Office [phone]  Fax [phone]

## 2023-05-23 IMAGING — US US AORTA
1 series · 14 of 25 positions shown · non-contrast
Comparison: None.

CLINICAL DATA: Abnormal x-ray.

EXAM:
ULTRASOUND OF ABDOMINAL AORTA
TECHNIQUE: Ultrasound examination of the abdominal aorta and proximal common
iliac arteries was performed to evaluate for aneurysm. Additional
color and Doppler images of the distal aorta were obtained to
document patency.

[Series 1: us aorta · 0.33mm/px · 14 of 40 slices shown]
[im 1/40]
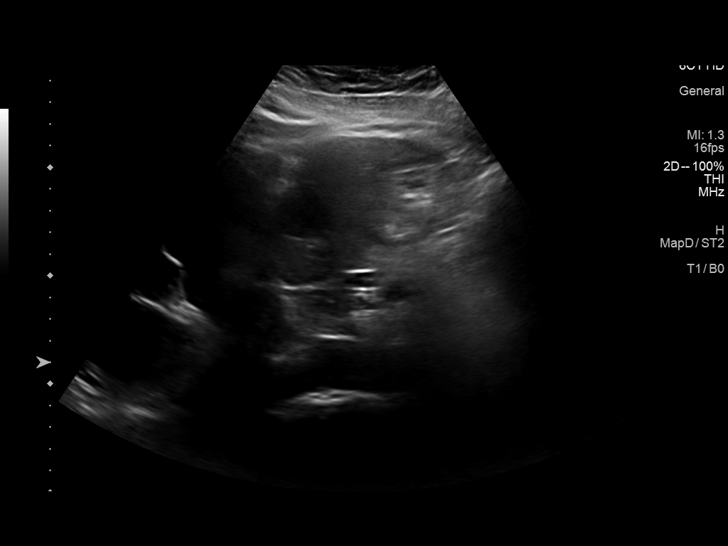
[im 4/40]
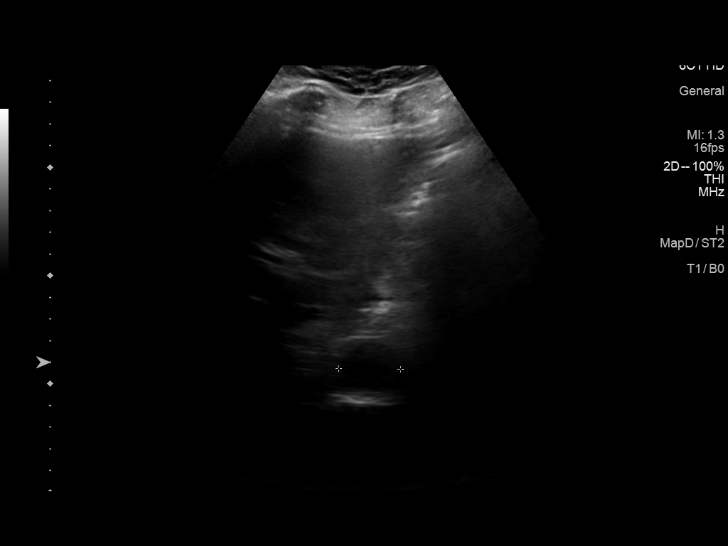
[im 7/40]
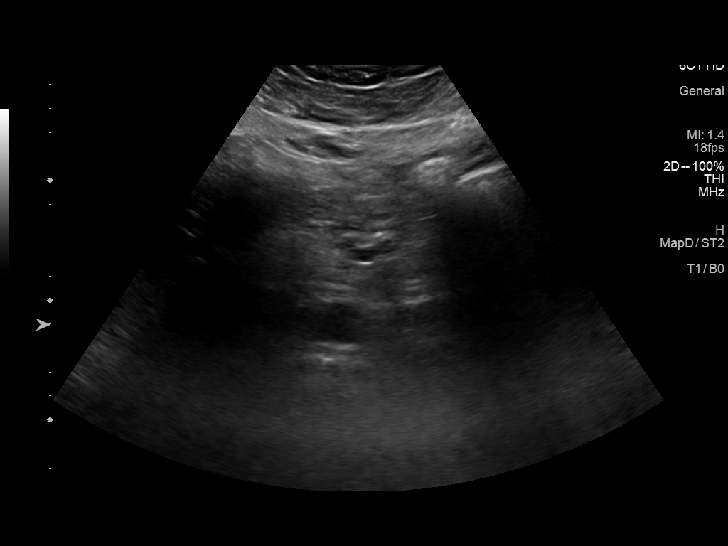
[im 10/40]
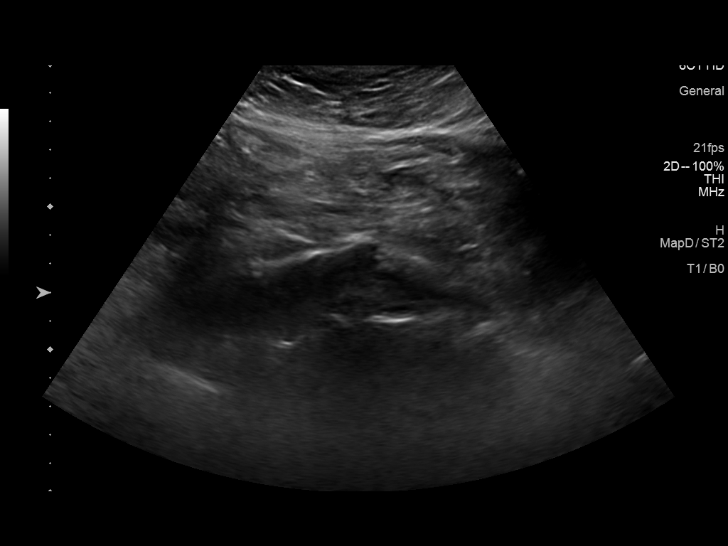
[im 14/40]
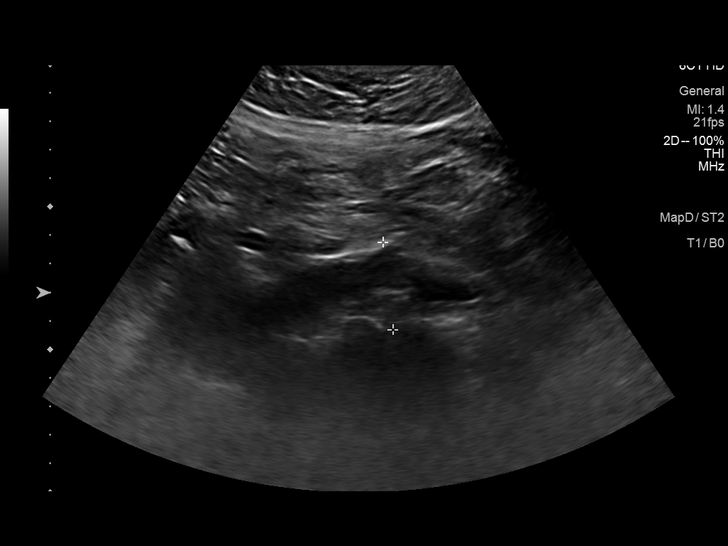
[im 15/40]
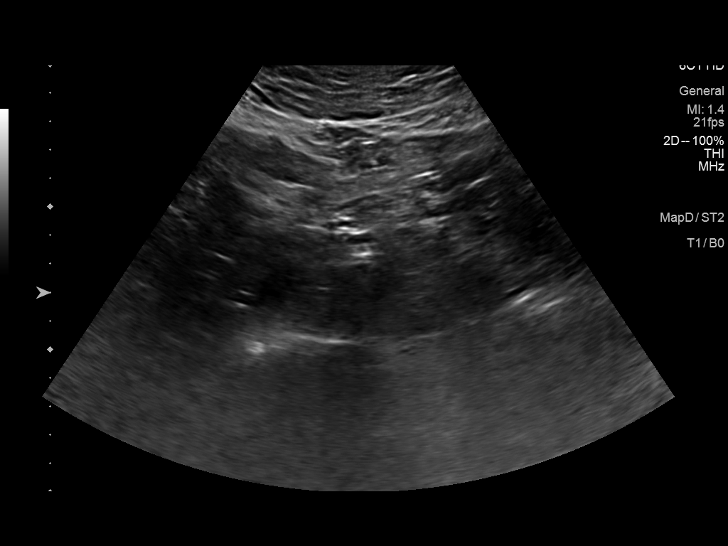
[im 18/40]
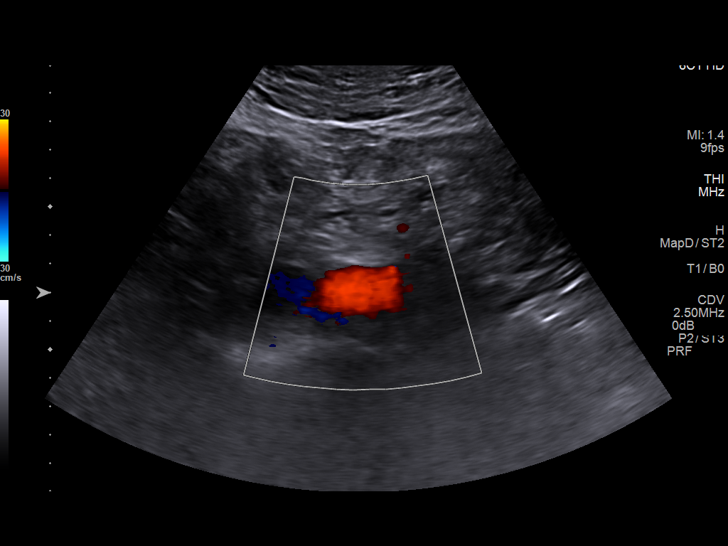
[im 22/40]
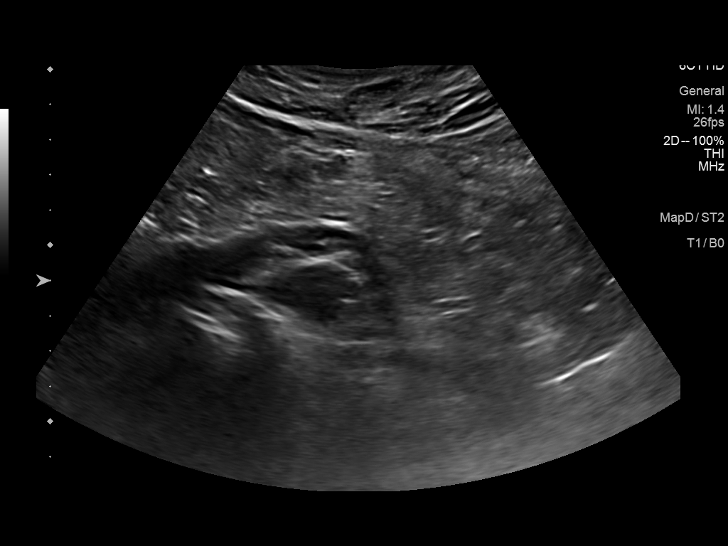
[im 25/40]
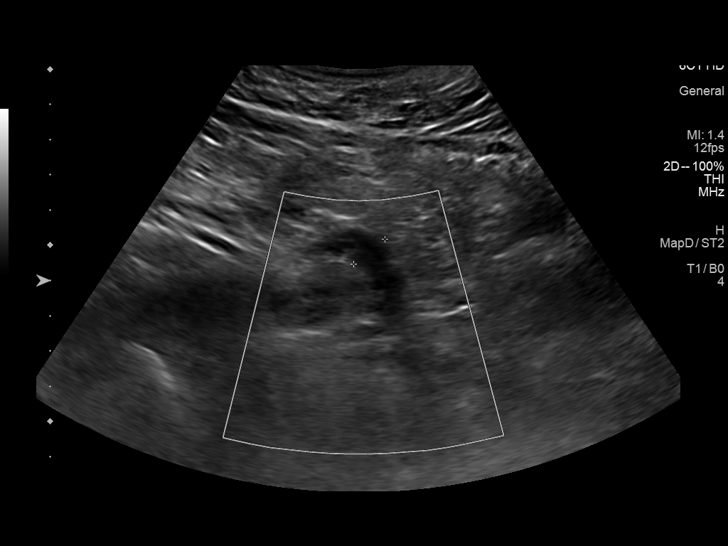
[im 27/40]
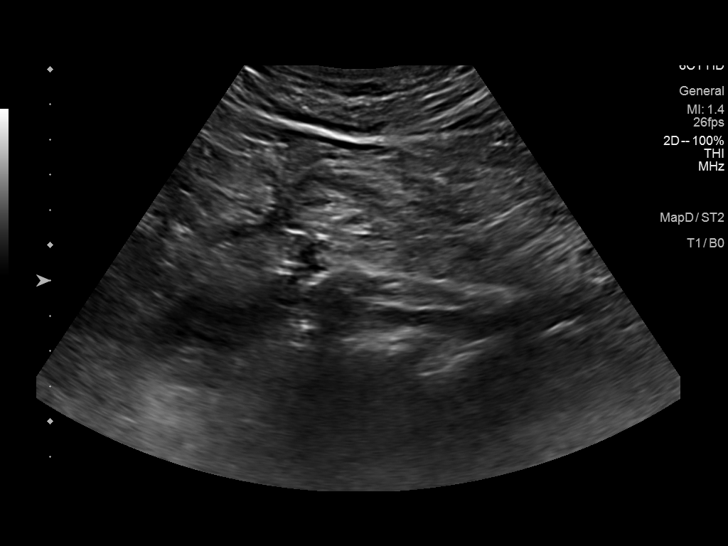
[im 30/40]
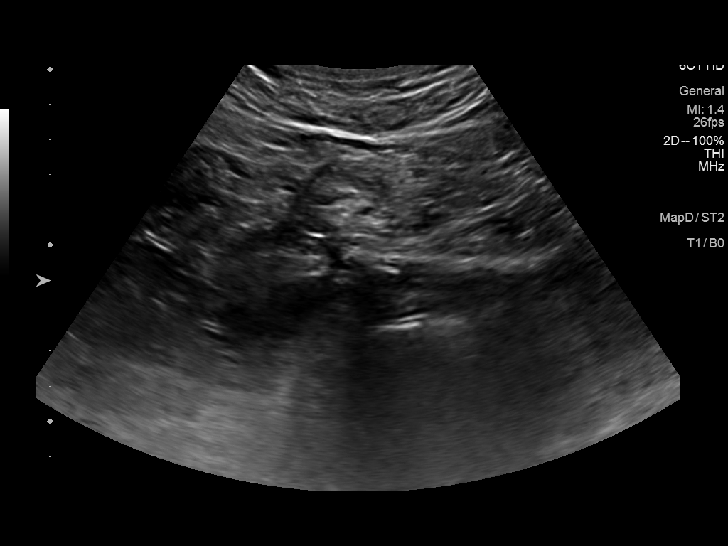
[im 33/40]
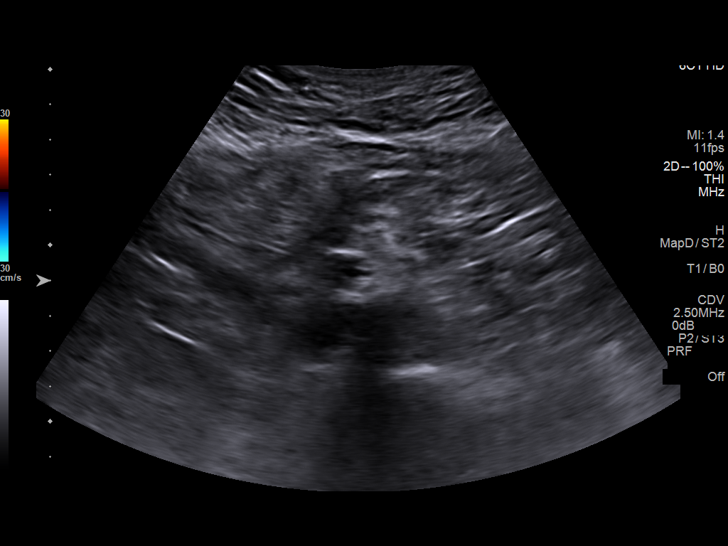
[im 36/40]
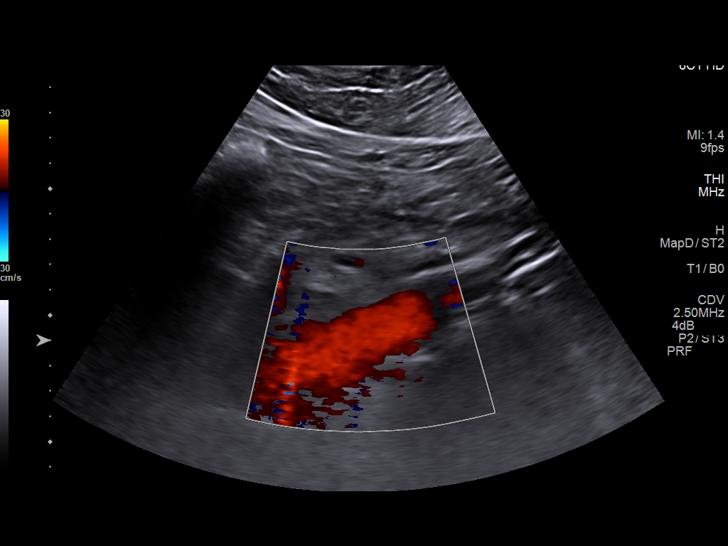
[im 40/40]
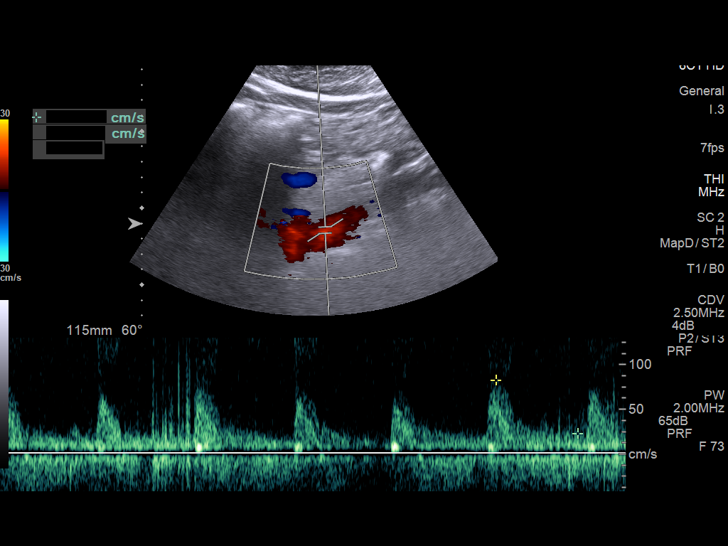

[14 of 25 positions shown; findings below may reference images not displayed]

FINDINGS: Abdominal aortic measurements as follows:

Proximal:  2.9 x 2.9 cm

Mid:  2.1 x 2.2 cm

Distal:  3.3 x 3.3 cm
Patent: Yes, peak systolic velocity is 83 cm/sec

Significant atherosclerosis present. The distal abdominal aorta is
mildly dilated and there appears to be a component of noncalcified
plaque/mural thrombus causing some degree of luminal stenosis at the
level of mild aneurysmal dilatation.

Right common iliac artery: 1.3 cm

Left common iliac artery: 1.3 cm
IMPRESSION: Atherosclerosis with mild aneurysmal dilatation of the distal
abdominal aorta up to estimated diameter of 3.3 cm by ultrasound.
Luminal stenosis is caused by a component of mural thrombus/soft
plaque at the level of aneurysmal dilatation.

Recommend follow-up ultrasound every 3 years. This recommendation
follows ACR consensus guidelines: White Paper of the ACR [employer]

## 2023-05-30 DIAGNOSIS — Z96651 Presence of right artificial knee joint: Secondary | ICD-10-CM | POA: Diagnosis not present

## 2023-06-11 DIAGNOSIS — H35352 Cystoid macular degeneration, left eye: Secondary | ICD-10-CM | POA: Diagnosis not present

## 2023-06-11 DIAGNOSIS — H401133 Primary open-angle glaucoma, bilateral, severe stage: Secondary | ICD-10-CM | POA: Diagnosis not present

## 2023-06-11 DIAGNOSIS — H43812 Vitreous degeneration, left eye: Secondary | ICD-10-CM | POA: Diagnosis not present

## 2023-06-11 DIAGNOSIS — H2511 Age-related nuclear cataract, right eye: Secondary | ICD-10-CM | POA: Diagnosis not present

## 2023-06-11 DIAGNOSIS — H0102B Squamous blepharitis left eye, upper and lower eyelids: Secondary | ICD-10-CM | POA: Diagnosis not present

## 2023-06-11 DIAGNOSIS — H0102A Squamous blepharitis right eye, upper and lower eyelids: Secondary | ICD-10-CM | POA: Diagnosis not present

## 2023-06-11 DIAGNOSIS — Z961 Presence of intraocular lens: Secondary | ICD-10-CM | POA: Diagnosis not present

## 2023-07-31 DIAGNOSIS — R071 Chest pain on breathing: Secondary | ICD-10-CM | POA: Diagnosis not present

## 2023-08-07 ENCOUNTER — Ambulatory Visit: Payer: PPO | Admitting: Podiatry

## 2023-08-07 DIAGNOSIS — I10 Essential (primary) hypertension: Secondary | ICD-10-CM | POA: Diagnosis not present

## 2023-08-13 DIAGNOSIS — J4520 Mild intermittent asthma, uncomplicated: Secondary | ICD-10-CM | POA: Diagnosis not present

## 2023-08-13 DIAGNOSIS — R634 Abnormal weight loss: Secondary | ICD-10-CM | POA: Diagnosis not present

## 2023-08-13 DIAGNOSIS — E7800 Pure hypercholesterolemia, unspecified: Secondary | ICD-10-CM | POA: Diagnosis not present

## 2023-08-13 DIAGNOSIS — D649 Anemia, unspecified: Secondary | ICD-10-CM | POA: Diagnosis not present

## 2023-08-13 DIAGNOSIS — I10 Essential (primary) hypertension: Secondary | ICD-10-CM | POA: Diagnosis not present

## 2023-08-14 ENCOUNTER — Encounter: Payer: Self-pay | Admitting: Podiatry

## 2023-08-14 ENCOUNTER — Ambulatory Visit: Admitting: Podiatry

## 2023-08-14 DIAGNOSIS — I739 Peripheral vascular disease, unspecified: Secondary | ICD-10-CM

## 2023-08-14 DIAGNOSIS — B351 Tinea unguium: Secondary | ICD-10-CM

## 2023-08-14 DIAGNOSIS — L84 Corns and callosities: Secondary | ICD-10-CM | POA: Diagnosis not present

## 2023-08-14 DIAGNOSIS — M79675 Pain in left toe(s): Secondary | ICD-10-CM | POA: Diagnosis not present

## 2023-08-14 DIAGNOSIS — M79674 Pain in right toe(s): Secondary | ICD-10-CM | POA: Diagnosis not present

## 2023-08-19 NOTE — Progress Notes (Signed)
  Subjective:  Patient ID: Joel Howell, male    DOB: 1940/09/22,  MRN: [id number]  Joel Howell presents to clinic today for at risk foot care. Patient has h/o PAD and painful porokeratotic lesion(s) left lower extremity and painful mycotic toenails that limit ambulation. Painful toenails interfere with ambulation. Aggravating factors include wearing enclosed shoe gear. Pain is relieved with periodic professional debridement. Painful porokeratotic lesions are aggravated when weightbearing with and without shoegear. Pain is relieved with periodic professional debridement.  Chief Complaint  Patient presents with   Nail Problem    RFC   New problem(s): None.   PCP is Tena Feeling, MD.  Allergies  Allergen Reactions   Penicillins Swelling and Rash    Swelling of tongue   Penicillin G     Other reaction(s): tongue swells Other reaction(s): tongue swells   Shellfish-Derived Products     Other reaction(s): tongue swelling Other reaction(s): tongue swelling    Review of Systems: Negative except as noted in the HPI.  Objective: No changes noted in today's physical examination. There were no vitals filed for this visit. JEWETT MCGANN is a pleasant 83 y.o. male WD, WN in NAD. AAO x 3.  Vascular Examination: CFT <3 seconds b/l. DP/PT  pulses faintly palpable RLE. Pedal pulses nonpalpable LLE. Digital hair absent. Skin temperature gradient warm to warm b/l. No pain with calf compression. No ischemia or gangrene. No cyanosis or clubbing noted b/l.    Neurological Examination: Sensation grossly intact b/l with 10 gram monofilament. Vibratory sensation intact b/l.   Dermatological Examination: Pedal skin thin and atrophic b/l LE. No open wounds b/l LE. No interdigital macerations noted b/l LE. Toenails 1-5 b/l elongated, discolored, dystrophic, thickened, crumbly with subungual debris and tenderness to dorsal palpation. Porokeratotic lesion(s) submet head 4 left foot. No erythema, no  edema, no drainage, no fluctuance.  Musculoskeletal Examination: Muscle strength 5/5 to b/l LE. Muscle strength 5/5 to all lower extremity muscle groups bilaterally. No pain, crepitus or joint limitation noted with ROM bilateral LE. Plantarflexed metatarsal(s) 4th metatarsal head left foot.  Radiographs: None  Assessment/Plan: 1. Pain due to onychomycosis of toenails of both feet   2. Callus of foot   3. PAD (peripheral artery disease) (HCC)    Consent given for treatment. Patient examined. All patient's and/or POA's questions/concerns addressed on today's visit. Toenails 1-5 debrided in length and girth without incident. Porokeratotic lesion(s) submet head 4 left foot pared and enucleated with sharp debridement without incident. Continue soft, supportive shoe gear daily. Report any pedal injuries to medical professional. Call office if there are any questions/concerns. -Patient/POA to call should there be question/concern in the interim.   Return in about 3 months (around 11/14/2023).  Luella Sager, DPM      [place] LOCATION: [address] [place] [address]                   Office [phone]   [facility] LOCATION: [address] [place], [place] [address] Office [phone]

## 2023-08-21 DIAGNOSIS — I10 Essential (primary) hypertension: Secondary | ICD-10-CM | POA: Diagnosis not present

## 2023-08-24 DIAGNOSIS — Z8546 Personal history of malignant neoplasm of prostate: Secondary | ICD-10-CM | POA: Diagnosis not present

## 2023-08-24 DIAGNOSIS — I10 Essential (primary) hypertension: Secondary | ICD-10-CM | POA: Diagnosis not present

## 2023-08-24 DIAGNOSIS — E7800 Pure hypercholesterolemia, unspecified: Secondary | ICD-10-CM | POA: Diagnosis not present

## 2023-08-30 DIAGNOSIS — I10 Essential (primary) hypertension: Secondary | ICD-10-CM | POA: Diagnosis not present

## 2023-09-19 DIAGNOSIS — I10 Essential (primary) hypertension: Secondary | ICD-10-CM | POA: Diagnosis not present

## 2023-09-23 DIAGNOSIS — E7800 Pure hypercholesterolemia, unspecified: Secondary | ICD-10-CM | POA: Diagnosis not present

## 2023-09-23 DIAGNOSIS — Z8546 Personal history of malignant neoplasm of prostate: Secondary | ICD-10-CM | POA: Diagnosis not present

## 2023-09-23 DIAGNOSIS — I10 Essential (primary) hypertension: Secondary | ICD-10-CM | POA: Diagnosis not present

## 2023-10-01 DIAGNOSIS — I10 Essential (primary) hypertension: Secondary | ICD-10-CM | POA: Diagnosis not present

## 2023-10-02 DIAGNOSIS — H0102B Squamous blepharitis left eye, upper and lower eyelids: Secondary | ICD-10-CM | POA: Diagnosis not present

## 2023-10-02 DIAGNOSIS — H0102A Squamous blepharitis right eye, upper and lower eyelids: Secondary | ICD-10-CM | POA: Diagnosis not present

## 2023-10-02 DIAGNOSIS — H401133 Primary open-angle glaucoma, bilateral, severe stage: Secondary | ICD-10-CM | POA: Diagnosis not present

## 2023-10-02 DIAGNOSIS — H2511 Age-related nuclear cataract, right eye: Secondary | ICD-10-CM | POA: Diagnosis not present

## 2023-10-02 DIAGNOSIS — Z961 Presence of intraocular lens: Secondary | ICD-10-CM | POA: Diagnosis not present

## 2023-10-03 DIAGNOSIS — E875 Hyperkalemia: Secondary | ICD-10-CM | POA: Diagnosis not present

## 2023-10-19 DIAGNOSIS — I10 Essential (primary) hypertension: Secondary | ICD-10-CM | POA: Diagnosis not present

## 2023-10-24 DIAGNOSIS — I10 Essential (primary) hypertension: Secondary | ICD-10-CM | POA: Diagnosis not present

## 2023-10-24 DIAGNOSIS — Z8546 Personal history of malignant neoplasm of prostate: Secondary | ICD-10-CM | POA: Diagnosis not present

## 2023-10-24 DIAGNOSIS — E7800 Pure hypercholesterolemia, unspecified: Secondary | ICD-10-CM | POA: Diagnosis not present

## 2023-11-07 DIAGNOSIS — E875 Hyperkalemia: Secondary | ICD-10-CM | POA: Diagnosis not present

## 2023-11-18 DIAGNOSIS — I10 Essential (primary) hypertension: Secondary | ICD-10-CM | POA: Diagnosis not present

## 2023-11-24 DIAGNOSIS — I10 Essential (primary) hypertension: Secondary | ICD-10-CM | POA: Diagnosis not present

## 2023-11-24 DIAGNOSIS — Z8546 Personal history of malignant neoplasm of prostate: Secondary | ICD-10-CM | POA: Diagnosis not present

## 2023-11-24 DIAGNOSIS — E7800 Pure hypercholesterolemia, unspecified: Secondary | ICD-10-CM | POA: Diagnosis not present

## 2023-12-05 DIAGNOSIS — E041 Nontoxic single thyroid nodule: Secondary | ICD-10-CM | POA: Diagnosis not present

## 2023-12-05 DIAGNOSIS — I714 Abdominal aortic aneurysm, without rupture, unspecified: Secondary | ICD-10-CM | POA: Diagnosis not present

## 2023-12-05 DIAGNOSIS — I700 Atherosclerosis of aorta: Secondary | ICD-10-CM | POA: Diagnosis not present

## 2023-12-18 DIAGNOSIS — I10 Essential (primary) hypertension: Secondary | ICD-10-CM | POA: Diagnosis not present

## 2023-12-24 DIAGNOSIS — Z8546 Personal history of malignant neoplasm of prostate: Secondary | ICD-10-CM | POA: Diagnosis not present

## 2023-12-24 DIAGNOSIS — I10 Essential (primary) hypertension: Secondary | ICD-10-CM | POA: Diagnosis not present

## 2023-12-24 DIAGNOSIS — E7800 Pure hypercholesterolemia, unspecified: Secondary | ICD-10-CM | POA: Diagnosis not present

## 2023-12-31 ENCOUNTER — Encounter: Payer: Self-pay | Admitting: Podiatry

## 2023-12-31 ENCOUNTER — Ambulatory Visit: Admitting: Podiatry

## 2023-12-31 DIAGNOSIS — L84 Corns and callosities: Secondary | ICD-10-CM | POA: Diagnosis not present

## 2023-12-31 DIAGNOSIS — M79675 Pain in left toe(s): Secondary | ICD-10-CM

## 2023-12-31 DIAGNOSIS — B351 Tinea unguium: Secondary | ICD-10-CM

## 2023-12-31 DIAGNOSIS — M79674 Pain in right toe(s): Secondary | ICD-10-CM

## 2023-12-31 DIAGNOSIS — I739 Peripheral vascular disease, unspecified: Secondary | ICD-10-CM | POA: Diagnosis not present

## 2024-01-05 NOTE — Progress Notes (Signed)
  Subjective:  Patient ID: Joel Howell, male    DOB: 16-Oct-1940,  MRN: [id number]  Joel Howell presents to clinic today for at risk foot care. Patient has h/o PAD and callus(es) left lower extremity and painful mycotic toenails that are difficult to trim. Painful toenails interfere with ambulation. Aggravating factors include wearing enclosed shoe gear. Pain is relieved with periodic professional debridement. Painful calluses are aggravated when weightbearing with and without shoegear. Pain is relieved with periodic professional debridement.  Chief Complaint  Patient presents with   Nail Problem    I'm having my nails trimmed.   New problem(s): None.   PCP is Dwight Trula SQUIBB, MD. ARNETTA 10/24/2023.  Allergies  Allergen Reactions   Penicillins Swelling and Rash    Swelling of tongue   Penicillin G     Other reaction(s): tongue swells Other reaction(s): tongue swells   Shellfish Protein-Containing Drug Products     Other reaction(s): tongue swelling Other reaction(s): tongue swelling    Review of Systems: Negative except as noted in the HPI.  Objective: No changes noted in today's physical examination. There were no vitals filed for this visit. Joel Howell is a pleasant 83 y.o. male WD, WN in NAD. AAO x 3.  Vascular Examination: CFT <3 seconds b/l. DP/PT  pulses faintly palpable RLE. Pedal pulses nonpalpable LLE. Digital hair absent. Skin temperature gradient warm to warm b/l. No pain with calf compression. No ischemia or gangrene. No cyanosis or clubbing noted b/l.    Neurological Examination: Sensation grossly intact b/l with 10 gram monofilament. Vibratory sensation intact b/l.   Dermatological Examination: Pedal skin thin and atrophic b/l LE. No open wounds b/l LE. No interdigital macerations noted b/l LE. Toenails 1-5 b/l elongated, discolored, dystrophic, thickened, crumbly with subungual debris and tenderness to dorsal palpation. Porokeratotic lesion(s) submet head  4 left foot. No erythema, no edema, no drainage, no fluctuance.  Musculoskeletal Examination: Muscle strength 5/5 to b/l LE. Muscle strength 5/5 to all lower extremity muscle groups bilaterally. No pain, crepitus or joint limitation noted with ROM bilateral LE. Plantarflexed metatarsal(s) 4th metatarsal head left foot.  Radiographs: None  Assessment/Plan: 1. Pain due to onychomycosis of toenails of both feet   2. Callus of foot   3. PAD (peripheral artery disease)   Patient was evaluated and treated. All patient's and/or POA's questions/concerns addressed on today's visit. Mycotic toenails 1-5 debrided in length and girth without incident. Callus(es) submet head 4 left foot pared with sharp debridement without incident. Continue soft, supportive shoe gear daily. Report any pedal injuries to medical professional. Call office if there are any questions/concerns.  Return in about 3 months (around 04/01/2024).  Joel Howell, DPM      [place] LOCATION: [address] [place] [address]                   Office [phone]   [facility] LOCATION: [address] [place], [place] [address] Office [phone]

## 2024-01-17 DIAGNOSIS — I10 Essential (primary) hypertension: Secondary | ICD-10-CM | POA: Diagnosis not present

## 2024-01-24 DIAGNOSIS — Z8546 Personal history of malignant neoplasm of prostate: Secondary | ICD-10-CM | POA: Diagnosis not present

## 2024-01-24 DIAGNOSIS — E7800 Pure hypercholesterolemia, unspecified: Secondary | ICD-10-CM | POA: Diagnosis not present

## 2024-01-24 DIAGNOSIS — I10 Essential (primary) hypertension: Secondary | ICD-10-CM | POA: Diagnosis not present

## 2024-02-05 DIAGNOSIS — H0102B Squamous blepharitis left eye, upper and lower eyelids: Secondary | ICD-10-CM | POA: Diagnosis not present

## 2024-02-05 DIAGNOSIS — H401133 Primary open-angle glaucoma, bilateral, severe stage: Secondary | ICD-10-CM | POA: Diagnosis not present

## 2024-02-05 DIAGNOSIS — H2511 Age-related nuclear cataract, right eye: Secondary | ICD-10-CM | POA: Diagnosis not present

## 2024-02-05 DIAGNOSIS — H0102A Squamous blepharitis right eye, upper and lower eyelids: Secondary | ICD-10-CM | POA: Diagnosis not present

## 2024-02-05 DIAGNOSIS — Z961 Presence of intraocular lens: Secondary | ICD-10-CM | POA: Diagnosis not present

## 2024-02-06 DIAGNOSIS — M1712 Unilateral primary osteoarthritis, left knee: Secondary | ICD-10-CM | POA: Diagnosis not present

## 2024-02-13 DIAGNOSIS — M1712 Unilateral primary osteoarthritis, left knee: Secondary | ICD-10-CM | POA: Diagnosis not present

## 2024-02-16 DIAGNOSIS — I10 Essential (primary) hypertension: Secondary | ICD-10-CM | POA: Diagnosis not present

## 2024-02-19 DIAGNOSIS — H9190 Unspecified hearing loss, unspecified ear: Secondary | ICD-10-CM | POA: Diagnosis not present

## 2024-02-19 DIAGNOSIS — Z0000 Encounter for general adult medical examination without abnormal findings: Secondary | ICD-10-CM | POA: Diagnosis not present

## 2024-02-19 DIAGNOSIS — I10 Essential (primary) hypertension: Secondary | ICD-10-CM | POA: Diagnosis not present

## 2024-02-19 DIAGNOSIS — Z1331 Encounter for screening for depression: Secondary | ICD-10-CM | POA: Diagnosis not present

## 2024-02-19 DIAGNOSIS — E041 Nontoxic single thyroid nodule: Secondary | ICD-10-CM | POA: Diagnosis not present

## 2024-02-19 DIAGNOSIS — E7800 Pure hypercholesterolemia, unspecified: Secondary | ICD-10-CM | POA: Diagnosis not present

## 2024-02-19 DIAGNOSIS — Z79899 Other long term (current) drug therapy: Secondary | ICD-10-CM | POA: Diagnosis not present

## 2024-02-19 DIAGNOSIS — Z8546 Personal history of malignant neoplasm of prostate: Secondary | ICD-10-CM | POA: Diagnosis not present

## 2024-02-19 DIAGNOSIS — M1712 Unilateral primary osteoarthritis, left knee: Secondary | ICD-10-CM | POA: Diagnosis not present

## 2024-02-19 DIAGNOSIS — D649 Anemia, unspecified: Secondary | ICD-10-CM | POA: Diagnosis not present

## 2024-02-19 DIAGNOSIS — Z23 Encounter for immunization: Secondary | ICD-10-CM | POA: Diagnosis not present

## 2024-02-23 DIAGNOSIS — E7800 Pure hypercholesterolemia, unspecified: Secondary | ICD-10-CM | POA: Diagnosis not present

## 2024-02-23 DIAGNOSIS — I10 Essential (primary) hypertension: Secondary | ICD-10-CM | POA: Diagnosis not present

## 2024-02-23 DIAGNOSIS — Z8546 Personal history of malignant neoplasm of prostate: Secondary | ICD-10-CM | POA: Diagnosis not present

## 2024-05-05 ENCOUNTER — Ambulatory Visit: Admitting: Podiatry

## 2024-05-11 ENCOUNTER — Ambulatory Visit: Admitting: Podiatry
